# Patient Record
Sex: Male | Born: 1980 | Race: White | Hispanic: No | Marital: Single | Smoking: Current every day smoker
Health system: Southern US, Community
[De-identification: ages and names within clinical notes are randomized; demographics above are authoritative.]

## PROBLEM LIST (undated history)

## (undated) DIAGNOSIS — T07XXXA Unspecified multiple injuries, initial encounter: Secondary | ICD-10-CM

## (undated) DIAGNOSIS — G43909 Migraine, unspecified, not intractable, without status migrainosus: Secondary | ICD-10-CM

## (undated) DIAGNOSIS — K219 Gastro-esophageal reflux disease without esophagitis: Secondary | ICD-10-CM

## (undated) DIAGNOSIS — I872 Venous insufficiency (chronic) (peripheral): Secondary | ICD-10-CM

## (undated) DIAGNOSIS — S62009A Unspecified fracture of navicular [scaphoid] bone of unspecified wrist, initial encounter for closed fracture: Secondary | ICD-10-CM

## (undated) HISTORY — PX: TONSILLECTOMY: SUR1361

---

## 2002-06-30 ENCOUNTER — Emergency Department (HOSPITAL_COMMUNITY): Admission: EM | Admit: 2002-06-30 | Discharge: 2002-07-01 | Payer: Self-pay | Admitting: Plastic Surgery

## 2002-07-01 ENCOUNTER — Encounter: Payer: Self-pay | Admitting: Emergency Medicine

## 2003-02-16 ENCOUNTER — Encounter: Payer: Self-pay | Admitting: Emergency Medicine

## 2003-02-16 ENCOUNTER — Emergency Department (HOSPITAL_COMMUNITY): Admission: EM | Admit: 2003-02-16 | Discharge: 2003-02-16 | Payer: Self-pay | Admitting: Emergency Medicine

## 2004-04-01 ENCOUNTER — Emergency Department (HOSPITAL_COMMUNITY): Admission: EM | Admit: 2004-04-01 | Discharge: 2004-04-01 | Payer: Self-pay | Admitting: Emergency Medicine

## 2005-01-19 ENCOUNTER — Ambulatory Visit (HOSPITAL_COMMUNITY): Admission: RE | Admit: 2005-01-19 | Discharge: 2005-01-19 | Payer: Self-pay | Admitting: Gastroenterology

## 2005-11-05 ENCOUNTER — Emergency Department (HOSPITAL_COMMUNITY): Admission: EM | Admit: 2005-11-05 | Discharge: 2005-11-05 | Payer: Self-pay | Admitting: Emergency Medicine

## 2011-01-04 ENCOUNTER — Ambulatory Visit (INDEPENDENT_AMBULATORY_CARE_PROVIDER_SITE_OTHER): Payer: Managed Care, Other (non HMO)

## 2011-01-04 ENCOUNTER — Inpatient Hospital Stay (INDEPENDENT_AMBULATORY_CARE_PROVIDER_SITE_OTHER)
Admission: RE | Admit: 2011-01-04 | Discharge: 2011-01-04 | Disposition: A | Discharge status: Home / Self Care | Payer: Managed Care, Other (non HMO) | Source: Ambulatory Visit | Attending: Emergency Medicine | Admitting: Emergency Medicine

## 2011-01-04 DIAGNOSIS — S058X9A Other injuries of unspecified eye and orbit, initial encounter: Secondary | ICD-10-CM

## 2011-01-04 DIAGNOSIS — H18009 Unspecified corneal deposit, unspecified eye: Secondary | ICD-10-CM

## 2011-09-14 ENCOUNTER — Ambulatory Visit (HOSPITAL_BASED_OUTPATIENT_CLINIC_OR_DEPARTMENT_OTHER): Admission: RE | Admit: 2011-09-14 | Payer: BC Managed Care – PPO | Source: Ambulatory Visit | Admitting: Otolaryngology

## 2011-09-14 ENCOUNTER — Encounter (HOSPITAL_BASED_OUTPATIENT_CLINIC_OR_DEPARTMENT_OTHER): Admission: RE | Payer: Self-pay | Source: Ambulatory Visit

## 2011-09-14 SURGERY — MINOR EXCISION OF MASS
Anesthesia: LOCAL | Laterality: Right

## 2014-03-08 ENCOUNTER — Emergency Department (HOSPITAL_COMMUNITY): Payer: BC Managed Care – PPO

## 2014-03-08 ENCOUNTER — Emergency Department (HOSPITAL_COMMUNITY)
Admission: EM | Admit: 2014-03-08 | Discharge: 2014-03-09 | Disposition: A | Discharge status: Home / Self Care | Payer: BC Managed Care – PPO | Attending: Emergency Medicine | Admitting: Emergency Medicine

## 2014-03-08 ENCOUNTER — Encounter (HOSPITAL_COMMUNITY): Payer: Self-pay | Admitting: Emergency Medicine

## 2014-03-08 DIAGNOSIS — F17200 Nicotine dependence, unspecified, uncomplicated: Secondary | ICD-10-CM | POA: Insufficient documentation

## 2014-03-08 DIAGNOSIS — T07XXXA Unspecified multiple injuries, initial encounter: Secondary | ICD-10-CM

## 2014-03-08 DIAGNOSIS — Y92410 Unspecified street and highway as the place of occurrence of the external cause: Secondary | ICD-10-CM | POA: Insufficient documentation

## 2014-03-08 DIAGNOSIS — S62009A Unspecified fracture of navicular [scaphoid] bone of unspecified wrist, initial encounter for closed fracture: Secondary | ICD-10-CM

## 2014-03-08 DIAGNOSIS — Z23 Encounter for immunization: Secondary | ICD-10-CM | POA: Insufficient documentation

## 2014-03-08 DIAGNOSIS — Y9389 Activity, other specified: Secondary | ICD-10-CM | POA: Insufficient documentation

## 2014-03-08 DIAGNOSIS — IMO0002 Reserved for concepts with insufficient information to code with codable children: Secondary | ICD-10-CM | POA: Insufficient documentation

## 2014-03-08 HISTORY — DX: Unspecified multiple injuries, initial encounter: T07.XXXA

## 2014-03-08 HISTORY — DX: Unspecified fracture of navicular (scaphoid) bone of unspecified wrist, initial encounter for closed fracture: S62.009A

## 2014-03-08 LAB — CBC WITH DIFFERENTIAL/PLATELET
BASOS PCT: 0 % (ref 0–1)
Basophils Absolute: 0 10*3/uL (ref 0.0–0.1)
Eosinophils Absolute: 0.2 10*3/uL (ref 0.0–0.7)
Eosinophils Relative: 2 % (ref 0–5)
HCT: 43 % (ref 39.0–52.0)
HEMOGLOBIN: 15.5 g/dL (ref 13.0–17.0)
LYMPHS PCT: 50 % — AB (ref 12–46)
Lymphs Abs: 5.3 10*3/uL — ABNORMAL HIGH (ref 0.7–4.0)
MCH: 32.3 pg (ref 26.0–34.0)
MCHC: 36 g/dL (ref 30.0–36.0)
MCV: 89.6 fL (ref 78.0–100.0)
Monocytes Absolute: 0.6 10*3/uL (ref 0.1–1.0)
Monocytes Relative: 6 % (ref 3–12)
Neutro Abs: 4.5 10*3/uL (ref 1.7–7.7)
Neutrophils Relative %: 42 % — ABNORMAL LOW (ref 43–77)
RBC: 4.8 MIL/uL (ref 4.22–5.81)
RDW: 12.7 % (ref 11.5–15.5)
WBC: 10.6 10*3/uL — ABNORMAL HIGH (ref 4.0–10.5)

## 2014-03-08 LAB — I-STAT CHEM 8, ED
BUN: 24 mg/dL — ABNORMAL HIGH (ref 6–23)
CREATININE: 1 mg/dL (ref 0.50–1.35)
Calcium, Ion: 1.04 mmol/L — ABNORMAL LOW (ref 1.12–1.23)
Chloride: 104 mEq/L (ref 96–112)
Glucose, Bld: 119 mg/dL — ABNORMAL HIGH (ref 70–99)
HEMATOCRIT: 45 % (ref 39.0–52.0)
Hemoglobin: 15.3 g/dL (ref 13.0–17.0)
POTASSIUM: 5.7 meq/L — AB (ref 3.7–5.3)
SODIUM: 134 meq/L — AB (ref 137–147)
TCO2: 23 mmol/L (ref 0–100)

## 2014-03-08 LAB — URINALYSIS, ROUTINE W REFLEX MICROSCOPIC
Bilirubin Urine: NEGATIVE
Glucose, UA: NEGATIVE mg/dL
Hgb urine dipstick: NEGATIVE
KETONES UR: NEGATIVE mg/dL
LEUKOCYTES UA: NEGATIVE
NITRITE: NEGATIVE
PH: 6 (ref 5.0–8.0)
Protein, ur: NEGATIVE mg/dL
SPECIFIC GRAVITY, URINE: 1.008 (ref 1.005–1.030)
UROBILINOGEN UA: 0.2 mg/dL (ref 0.0–1.0)

## 2014-03-08 MED ORDER — MORPHINE SULFATE 4 MG/ML IJ SOLN
6.0000 mg | Freq: Once | INTRAMUSCULAR | Status: AC
  Administered 2014-03-08: 6 mg via INTRAVENOUS
  Filled 2014-03-08: qty 2

## 2014-03-08 MED ORDER — MORPHINE SULFATE 4 MG/ML IJ SOLN
8.0000 mg | Freq: Once | INTRAMUSCULAR | Status: AC
  Administered 2014-03-08: 4 mg via INTRAVENOUS
  Filled 2014-03-08: qty 2

## 2014-03-08 MED ORDER — TETANUS-DIPHTH-ACELL PERTUSSIS 5-2.5-18.5 LF-MCG/0.5 IM SUSP
0.5000 mL | Freq: Once | INTRAMUSCULAR | Status: AC
  Administered 2014-03-08: 0.5 mL via INTRAMUSCULAR
  Filled 2014-03-08: qty 0.5

## 2014-03-08 MED ORDER — IOHEXOL 300 MG/ML  SOLN
80.0000 mL | Freq: Once | INTRAMUSCULAR | Status: AC | PRN
  Administered 2014-03-08: 80 mL via INTRAVENOUS

## 2014-03-08 NOTE — ED Notes (Signed)
PT RETURNED FROM XRAY.
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
## 2014-03-08 NOTE — ED Notes (Signed)
Cervical collar placed on pt on arrival to room 5 with cervical spine stabilization maintained

## 2014-03-08 NOTE — ED Notes (Signed)
Pt placed into gown and on monitor upon arrival to room. Pt monitored by 5 lead, blood pressure, and pulse ox.  
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
## 2014-03-08 NOTE — ED Notes (Signed)
Cervical collar removed by dr. Ethelda Chickjacubowitz

## 2014-03-08 NOTE — ED Notes (Signed)
Pt to x ray/ct
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
## 2014-03-09 MED ORDER — OXYCODONE-ACETAMINOPHEN 5-325 MG PO TABS: 1.0000 | ORAL_TABLET | Freq: Four times a day (QID) | ORAL | Status: DC | PRN

## 2014-03-09 MED ORDER — MORPHINE SULFATE 4 MG/ML IJ SOLN
4.0000 mg | Freq: Once | INTRAMUSCULAR | Status: AC
  Administered 2014-03-09: 4 mg via INTRAVENOUS
  Filled 2014-03-09: qty 1

## 2014-03-09 MED ORDER — LIDOCAINE HCL 2 % EX GEL
1.0000 "application " | Freq: Once | CUTANEOUS | Status: AC
  Administered 2014-03-09: 1
  Filled 2014-03-09: qty 20

## 2014-03-09 MED ORDER — BACITRACIN ZINC 500 UNIT/GM EX OINT
TOPICAL_OINTMENT | Freq: Two times a day (BID) | CUTANEOUS | Status: DC
  Administered 2014-03-09: 1 via TOPICAL

## 2014-03-09 NOTE — ED Notes (Signed)
Ortho paged to apply splint. 

## 2014-03-09 NOTE — ED Notes (Signed)
Tech attempting to clean and dress wounds at this time. Will call ortho to apply splint once wounds on affected hand are dressed.
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
## 2014-03-13 ENCOUNTER — Other Ambulatory Visit (HOSPITAL_COMMUNITY): Payer: Self-pay | Admitting: Orthopedic Surgery

## 2014-03-13 DIAGNOSIS — T148XXA Other injury of unspecified body region, initial encounter: Secondary | ICD-10-CM

## 2014-03-15 ENCOUNTER — Ambulatory Visit (HOSPITAL_COMMUNITY): Payer: BC Managed Care – PPO

## 2014-03-15 ENCOUNTER — Encounter (HOSPITAL_COMMUNITY): Payer: Self-pay | Admitting: Emergency Medicine

## 2014-03-15 ENCOUNTER — Emergency Department (INDEPENDENT_AMBULATORY_CARE_PROVIDER_SITE_OTHER): Payer: Self-pay

## 2014-03-15 ENCOUNTER — Emergency Department (INDEPENDENT_AMBULATORY_CARE_PROVIDER_SITE_OTHER)
Admission: EM | Admit: 2014-03-15 | Discharge: 2014-03-15 | Disposition: A | Discharge status: Home / Self Care | Payer: Self-pay | Source: Home / Self Care | Attending: Emergency Medicine | Admitting: Emergency Medicine

## 2014-03-15 DIAGNOSIS — IMO0002 Reserved for concepts with insufficient information to code with codable children: Secondary | ICD-10-CM

## 2014-03-15 DIAGNOSIS — T148XXA Other injury of unspecified body region, initial encounter: Secondary | ICD-10-CM

## 2014-03-15 DIAGNOSIS — S62001A Unspecified fracture of navicular [scaphoid] bone of right wrist, initial encounter for closed fracture: Secondary | ICD-10-CM

## 2014-03-15 DIAGNOSIS — S62009A Unspecified fracture of navicular [scaphoid] bone of unspecified wrist, initial encounter for closed fracture: Secondary | ICD-10-CM

## 2014-03-15 MED ORDER — OXYCODONE-ACETAMINOPHEN 5-325 MG PO TABS: 1.0000 | ORAL_TABLET | Freq: Four times a day (QID) | ORAL | Status: DC | PRN

## 2014-03-15 MED ORDER — ONDANSETRON HCL 4 MG PO TABS: 4.0000 mg | ORAL_TABLET | Freq: Three times a day (TID) | ORAL | Status: DC | PRN

## 2014-03-15 NOTE — ED Provider Notes (Signed)
Patient was placed into a well formed right thumb spica splint. Brendan [name], EVAN, S   [name], MD 03/15/14 [phone]

## 2014-03-15 NOTE — Discharge Instructions (Signed)
Make sure you see Dr. Eulah PontMurphy on Tuesday for the wrist fracture.  The abrasion look good.  Nothing looks infected. Wash daily with soap and warm water. Apply neosporin and cover with bandage daily.  The ribs and the right leg are not broken but may have bruising of the bone. Apply ice as tolerated. Take tylenol and ibuprofen regularly. Use the Percocet as needed for pain. Use zofran with the percocet to help with nausea.
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
## 2014-03-19 ENCOUNTER — Encounter (HOSPITAL_BASED_OUTPATIENT_CLINIC_OR_DEPARTMENT_OTHER): Payer: Self-pay | Admitting: *Deleted

## 2014-03-20 ENCOUNTER — Other Ambulatory Visit: Payer: Self-pay | Admitting: Physician Assistant

## 2014-03-21 ENCOUNTER — Encounter (HOSPITAL_BASED_OUTPATIENT_CLINIC_OR_DEPARTMENT_OTHER)
Admission: RE | Disposition: A | Discharge status: Home / Self Care | Payer: Self-pay | Source: Ambulatory Visit | Attending: Orthopedic Surgery

## 2014-03-21 ENCOUNTER — Encounter (HOSPITAL_BASED_OUTPATIENT_CLINIC_OR_DEPARTMENT_OTHER): Payer: Self-pay | Admitting: Anesthesiology

## 2014-03-21 ENCOUNTER — Ambulatory Visit (HOSPITAL_BASED_OUTPATIENT_CLINIC_OR_DEPARTMENT_OTHER)
Admission: RE | Admit: 2014-03-21 | Discharge: 2014-03-21 | Disposition: A | Discharge status: Home / Self Care | Payer: Self-pay | Source: Ambulatory Visit | Attending: Orthopedic Surgery | Admitting: Orthopedic Surgery

## 2014-03-21 ENCOUNTER — Ambulatory Visit (HOSPITAL_BASED_OUTPATIENT_CLINIC_OR_DEPARTMENT_OTHER): Payer: Self-pay | Admitting: Anesthesiology

## 2014-03-21 DIAGNOSIS — F17200 Nicotine dependence, unspecified, uncomplicated: Secondary | ICD-10-CM | POA: Insufficient documentation

## 2014-03-21 DIAGNOSIS — K219 Gastro-esophageal reflux disease without esophagitis: Secondary | ICD-10-CM | POA: Insufficient documentation

## 2014-03-21 DIAGNOSIS — Y929 Unspecified place or not applicable: Secondary | ICD-10-CM | POA: Insufficient documentation

## 2014-03-21 DIAGNOSIS — S62009A Unspecified fracture of navicular [scaphoid] bone of unspecified wrist, initial encounter for closed fracture: Secondary | ICD-10-CM | POA: Insufficient documentation

## 2014-03-21 HISTORY — DX: Unspecified multiple injuries, initial encounter: T07.XXXA

## 2014-03-21 HISTORY — DX: Unspecified fracture of navicular (scaphoid) bone of unspecified wrist, initial encounter for closed fracture: S62.009A

## 2014-03-21 HISTORY — PX: ORIF SCAPHOID FRACTURE: SHX2130

## 2014-03-21 HISTORY — DX: Gastro-esophageal reflux disease without esophagitis: K21.9

## 2014-03-21 HISTORY — DX: Migraine, unspecified, not intractable, without status migrainosus: G43.909

## 2014-03-21 SURGERY — OPEN REDUCTION INTERNAL FIXATION (ORIF) SCAPHOID FRACTURE
Anesthesia: General | Site: Hand | Laterality: Right

## 2014-03-21 MED ORDER — MIDAZOLAM HCL 2 MG/2ML IJ SOLN
INTRAMUSCULAR | Status: AC
  Filled 2014-03-21: qty 2

## 2014-03-21 MED ORDER — ONDANSETRON HCL 4 MG/2ML IJ SOLN
4.0000 mg | Freq: Four times a day (QID) | INTRAMUSCULAR | Status: DC | PRN
Start: 2014-03-21 — End: 2014-03-21

## 2014-03-21 MED ORDER — LACTATED RINGERS IV SOLN
INTRAVENOUS | Status: DC
  Administered 2014-03-21 (×2): via INTRAVENOUS

## 2014-03-21 MED ORDER — HYDROMORPHONE HCL PF 1 MG/ML IJ SOLN
0.2500 mg | INTRAMUSCULAR | Status: DC | PRN
  Administered 2014-03-21 (×4): 0.5 mg via INTRAVENOUS

## 2014-03-21 MED ORDER — MIDAZOLAM HCL 2 MG/ML PO SYRP
12.0000 mg | ORAL_SOLUTION | Freq: Once | ORAL | Status: DC | PRN
Start: 2014-03-21 — End: 2014-03-21

## 2014-03-21 MED ORDER — METOCLOPRAMIDE HCL 5 MG/ML IJ SOLN: 5.0000 mg | Freq: Three times a day (TID) | INTRAMUSCULAR | Status: DC | PRN

## 2014-03-21 MED ORDER — LIDOCAINE HCL (CARDIAC) 20 MG/ML IV SOLN
INTRAVENOUS | Status: DC | PRN
  Administered 2014-03-21: 50 mg via INTRAVENOUS

## 2014-03-21 MED ORDER — BUPIVACAINE HCL (PF) 0.25 % IJ SOLN
INTRAMUSCULAR | Status: AC
  Filled 2014-03-21: qty 30

## 2014-03-21 MED ORDER — BUPIVACAINE-EPINEPHRINE (PF) 0.5% -1:200000 IJ SOLN
INTRAMUSCULAR | Status: DC | PRN
  Administered 2014-03-21: 25 mL via PERINEURAL

## 2014-03-21 MED ORDER — BUPIVACAINE HCL (PF) 0.25 % IJ SOLN
INTRAMUSCULAR | Status: DC | PRN
  Administered 2014-03-21: 4 mL

## 2014-03-21 MED ORDER — FENTANYL CITRATE 0.05 MG/ML IJ SOLN
INTRAMUSCULAR | Status: AC
  Filled 2014-03-21: qty 4

## 2014-03-21 MED ORDER — OXYCODONE HCL 5 MG PO TABS: 5.0000 mg | ORAL_TABLET | Freq: Once | ORAL | Status: DC | PRN

## 2014-03-21 MED ORDER — CEFAZOLIN SODIUM-DEXTROSE 2-3 GM-% IV SOLR
INTRAVENOUS | Status: AC
  Filled 2014-03-21: qty 50

## 2014-03-21 MED ORDER — OXYCODONE HCL 5 MG/5ML PO SOLN: 5.0000 mg | Freq: Once | ORAL | Status: DC | PRN

## 2014-03-21 MED ORDER — CHLORHEXIDINE GLUCONATE 4 % EX LIQD
60.0000 mL | Freq: Once | CUTANEOUS | Status: DC
Start: 2014-03-21 — End: 2014-03-21

## 2014-03-21 MED ORDER — DIPHENHYDRAMINE HCL 50 MG/ML IJ SOLN
INTRAMUSCULAR | Status: AC
  Filled 2014-03-21: qty 1

## 2014-03-21 MED ORDER — DIPHENHYDRAMINE HCL 50 MG/ML IJ SOLN
25.0000 mg | Freq: Once | INTRAMUSCULAR | Status: AC
  Administered 2014-03-21: 25 mg via INTRAVENOUS

## 2014-03-21 MED ORDER — DEXAMETHASONE SODIUM PHOSPHATE 10 MG/ML IJ SOLN
INTRAMUSCULAR | Status: DC | PRN
  Administered 2014-03-21: 10 mg via INTRAVENOUS

## 2014-03-21 MED ORDER — FENTANYL CITRATE 0.05 MG/ML IJ SOLN
50.0000 ug | INTRAMUSCULAR | Status: DC | PRN
  Administered 2014-03-21 (×2): 100 ug via INTRAVENOUS

## 2014-03-21 MED ORDER — FENTANYL CITRATE 0.05 MG/ML IJ SOLN
INTRAMUSCULAR | Status: AC
  Filled 2014-03-21: qty 2

## 2014-03-21 MED ORDER — PROPOFOL 10 MG/ML IV BOLUS
INTRAVENOUS | Status: DC | PRN
  Administered 2014-03-21: 200 mg via INTRAVENOUS

## 2014-03-21 MED ORDER — HYDROMORPHONE HCL PF 1 MG/ML IJ SOLN: 0.5000 mg | INTRAMUSCULAR | Status: DC | PRN

## 2014-03-21 MED ORDER — MIDAZOLAM HCL 2 MG/2ML IJ SOLN
1.0000 mg | INTRAMUSCULAR | Status: DC | PRN
  Administered 2014-03-21 (×2): 2 mg via INTRAVENOUS

## 2014-03-21 MED ORDER — LACTATED RINGERS IV SOLN
INTRAVENOUS | Status: DC
  Administered 2014-03-21: 10 mL/h via INTRAVENOUS

## 2014-03-21 MED ORDER — OXYCODONE-ACETAMINOPHEN 5-325 MG PO TABS: 1.0000 | ORAL_TABLET | ORAL | Status: DC | PRN

## 2014-03-21 MED ORDER — ONDANSETRON HCL 4 MG PO TABS: 4.0000 mg | ORAL_TABLET | Freq: Four times a day (QID) | ORAL | Status: DC | PRN

## 2014-03-21 MED ORDER — HYDROMORPHONE HCL PF 1 MG/ML IJ SOLN
INTRAMUSCULAR | Status: AC
Start: 2014-03-21 — End: 2014-03-21
  Filled 2014-03-21: qty 1

## 2014-03-21 MED ORDER — CEFAZOLIN SODIUM-DEXTROSE 2-3 GM-% IV SOLR
2.0000 g | INTRAVENOUS | Status: AC
  Administered 2014-03-21: 2 g via INTRAVENOUS

## 2014-03-21 MED ORDER — ONDANSETRON HCL 4 MG/2ML IJ SOLN
4.0000 mg | Freq: Once | INTRAMUSCULAR | Status: DC | PRN
Start: 2014-03-21 — End: 2014-03-21

## 2014-03-21 MED ORDER — HYDROMORPHONE HCL PF 1 MG/ML IJ SOLN
INTRAMUSCULAR | Status: AC
  Filled 2014-03-21: qty 1

## 2014-03-21 MED ORDER — METOCLOPRAMIDE HCL 5 MG PO TABS: 5.0000 mg | ORAL_TABLET | Freq: Three times a day (TID) | ORAL | Status: DC | PRN

## 2014-03-21 SURGICAL SUPPLY — 60 items
BANDAGE ELASTIC 3 VELCRO ST LF (GAUZE/BANDAGES/DRESSINGS) ×3 IMPLANT
BANDAGE ELASTIC 4 VELCRO ST LF (GAUZE/BANDAGES/DRESSINGS) IMPLANT
BIT DRILL MINI LNG ACUTRAK 2 (BIT) ×1 IMPLANT
BLADE SURG 15 STRL LF DISP TIS (BLADE) ×1 IMPLANT
BLADE SURG 15 STRL SS (BLADE) ×2
BNDG COHESIVE 3X5 TAN STRL LF (GAUZE/BANDAGES/DRESSINGS) ×3 IMPLANT
BNDG ESMARK 4X9 LF (GAUZE/BANDAGES/DRESSINGS) IMPLANT
CANISTER SUCT 1200ML W/VALVE (MISCELLANEOUS) ×3 IMPLANT
COVER TABLE BACK 60X90 (DRAPES) ×3 IMPLANT
CUFF TOURNIQUET SINGLE 18IN (TOURNIQUET CUFF) IMPLANT
DECANTER SPIKE VIAL GLASS SM (MISCELLANEOUS) IMPLANT
DRAPE EXTREMITY T 121X128X90 (DRAPE) ×3 IMPLANT
DRAPE OEC MINIVIEW 54X84 (DRAPES) IMPLANT
DRAPE U-SHAPE 47X51 STRL (DRAPES) IMPLANT
DRILL MINI LNG ACUTRAK 2 (BIT) ×3
DURAPREP 26ML APPLICATOR (WOUND CARE) ×3 IMPLANT
ELECT NEEDLE TIP 2.8 STRL (NEEDLE) IMPLANT
ELECT REM PT RETURN 9FT ADLT (ELECTROSURGICAL) ×3
ELECTRODE REM PT RTRN 9FT ADLT (ELECTROSURGICAL) ×1 IMPLANT
GAUZE SPONGE 4X4 12PLY STRL (GAUZE/BANDAGES/DRESSINGS) ×3 IMPLANT
GAUZE XEROFORM 1X8 LF (GAUZE/BANDAGES/DRESSINGS) IMPLANT
GLOVE BIOGEL PI IND STRL 7.0 (GLOVE) ×1 IMPLANT
GLOVE BIOGEL PI INDICATOR 7.0 (GLOVE) ×2
GLOVE ECLIPSE 6.5 STRL STRAW (GLOVE) ×3 IMPLANT
GLOVE ORTHO TXT STRL SZ7.5 (GLOVE) ×6 IMPLANT
GOWN STRL REUS W/ TWL LRG LVL3 (GOWN DISPOSABLE) ×2 IMPLANT
GOWN STRL REUS W/ TWL XL LVL3 (GOWN DISPOSABLE) IMPLANT
GOWN STRL REUS W/TWL LRG LVL3 (GOWN DISPOSABLE) ×4
GOWN STRL REUS W/TWL XL LVL3 (GOWN DISPOSABLE) ×3 IMPLANT
GUIDEWIRE ORTHO MINI ACTK .045 (WIRE) ×6 IMPLANT
NEEDLE HYPO 25X1 1.5 SAFETY (NEEDLE) IMPLANT
NS IRRIG 1000ML POUR BTL (IV SOLUTION) ×3 IMPLANT
PACK BASIN DAY SURGERY FS (CUSTOM PROCEDURE TRAY) ×3 IMPLANT
PAD CAST 3X4 CTTN HI CHSV (CAST SUPPLIES) ×1 IMPLANT
PAD CAST 4YDX4 CTTN HI CHSV (CAST SUPPLIES) ×1 IMPLANT
PADDING CAST ABS 4INX4YD NS (CAST SUPPLIES) ×2
PADDING CAST ABS COTTON 4X4 ST (CAST SUPPLIES) ×1 IMPLANT
PADDING CAST COTTON 3X4 STRL (CAST SUPPLIES) ×2
PADDING CAST COTTON 4X4 STRL (CAST SUPPLIES) ×2
PENCIL BUTTON HOLSTER BLD 10FT (ELECTRODE) ×3 IMPLANT
SCREW ACUTRAK 2 MINI 26MM (Screw) ×3 IMPLANT
SHEET MEDIUM DRAPE 40X70 STRL (DRAPES) ×3 IMPLANT
SLEEVE SCD COMPRESS KNEE MED (MISCELLANEOUS) IMPLANT
SPLINT PLASTER CAST XFAST 3X15 (CAST SUPPLIES) ×15 IMPLANT
SPLINT PLASTER XTRA FASTSET 3X (CAST SUPPLIES) ×30
SPONGE LAP 4X18 X RAY DECT (DISPOSABLE) IMPLANT
STAPLER VISISTAT 35W (STAPLE) IMPLANT
STOCKINETTE 4X48 STRL (DRAPES) ×3 IMPLANT
SUCTION FRAZIER TIP 10 FR DISP (SUCTIONS) ×3 IMPLANT
SUT ETHILON 3 0 PS 1 (SUTURE) IMPLANT
SUT VIC AB 2-0 SH 27 (SUTURE) ×2
SUT VIC AB 2-0 SH 27XBRD (SUTURE) ×1 IMPLANT
SUT VIC AB 3-0 SH 27 (SUTURE)
SUT VIC AB 3-0 SH 27X BRD (SUTURE) IMPLANT
SYR BULB 3OZ (MISCELLANEOUS) ×3 IMPLANT
SYR CONTROL 10ML LL (SYRINGE) IMPLANT
TOWEL OR 17X24 6PK STRL BLUE (TOWEL DISPOSABLE) ×3 IMPLANT
TUBE CONNECTING 20'X1/4 (TUBING) ×1
TUBE CONNECTING 20X1/4 (TUBING) ×2 IMPLANT
UNDERPAD 30X30 INCONTINENT (UNDERPADS AND DIAPERS) ×3 IMPLANT

## 2014-03-21 NOTE — Transfer of Care (Signed)
Immediate Anesthesia Transfer of Care Note  Patient: Brendan [name]  Procedure(s) Performed: Procedure(s): OPEN REDUCTION INTERNAL FIXATION (ORIF) RIGHT  SCAPHOID FRACTURE (Right)  Patient Location: PACU  Anesthesia Type:General  Level of Consciousness: awake and alert   Airway & Oxygen Therapy: Patient Spontanous Breathing and Patient connected to face mask oxygen  Post-op Assessment: Report given to PACU RN and Post -op Vital signs reviewed and stable  Post vital signs: Reviewed and stable  Complications: No apparent anesthesia complications

## 2014-03-21 NOTE — Anesthesia Procedure Notes (Addendum)
Anesthesia Regional Block:  Supraclavicular block  Pre-Anesthetic Checklist: ,, timeout performed, Correct Patient, Correct Site, Correct Laterality, Correct Procedure, Correct Position, site marked, Risks and benefits discussed,  Surgical consent,  Pre-op evaluation,  At surgeon's request and post-op pain management  Laterality: Right and Upper  Prep: chloraprep       Needles:  Injection technique: Single-shot  Needle Type: Echogenic Stimulator Needle     Needle Length: 5cm 5 cm Needle Gauge: 21 and 21 G    Additional Needles:  Procedures: ultrasound guided (picture in chart) Supraclavicular block Narrative:  Start time: 03/21/2014 11:22 AM End time: 03/21/2014 11:28 AM Injection made incrementally with aspirations every 5 mL.  Performed by: Personally  Anesthesiologist: Sheldon Silvan   Procedure Name: LMA Insertion Date/Time: 03/21/2014 12:14 PM Performed by: Caren Macadam Pre-anesthesia Checklist: Patient identified, Emergency Drugs available, Suction available and Patient being monitored Patient Re-evaluated:Patient Re-evaluated prior to inductionOxygen Delivery Method: Circle System Utilized Preoxygenation: Pre-oxygenation with 100% oxygen Intubation Type: IV induction Ventilation: Mask ventilation without difficulty LMA: LMA inserted LMA Size: 5.0 Number of attempts: 1 Airway Equipment and Method: bite block Placement Confirmation: positive ETCO2 and breath sounds checked- equal and bilateral Tube secured with: Tape Dental Injury: Teeth and Oropharynx as per pre-operative assessment     Procedure Name: LMA Insertion Date/Time: 03/21/2014 12:14 PM Performed by: Caren Macadam Pre-anesthesia Checklist: Patient identified, Emergency Drugs available, Suction available and Patient being monitored Patient Re-evaluated:Patient Re-evaluated prior to inductionOxygen Delivery Method: Circle System Utilized Preoxygenation: Pre-oxygenation with 100% oxygen Intubation  Type: IV induction Ventilation: Mask ventilation without difficulty LMA: LMA inserted LMA Size: 5.0 Number of attempts: 1 Airway Equipment and Method: bite block Placement Confirmation: positive ETCO2 and breath sounds checked- equal and bilateral Tube secured with: Tape Dental Injury: Teeth and Oropharynx as per pre-operative assessment

## 2014-03-21 NOTE — Interval H&P Note (Signed)
History and Physical Interval Note:  03/21/2014 7:28 AM  Brendan [name]  has presented today for surgery, with the diagnosis of RIGHT SCAPHOID FRACTURE  The various methods of treatment have been discussed with the patient and family. After consideration of risks, benefits and other options for treatment, the patient has consented to  Procedure(s): OPEN REDUCTION INTERNAL FIXATION (ORIF) RIGHT  SCAPHOID FRACTURE (Right) as a surgical intervention .  The patient's history has been reviewed, patient examined, no change in status, stable for surgery.  I have reviewed the patient's chart and labs.  Questions were answered to the patient's satisfaction.     [name]

## 2014-03-21 NOTE — Progress Notes (Signed)
Assisted Dr. Crews with right, ultrasound guided, supraclavicular block. Side rails up, monitors on throughout procedure. See vital signs in flow sheet. Tolerated Procedure well. 

## 2014-03-21 NOTE — Discharge Instructions (Signed)
ORIF Right Scaphoid Fracture  Wear sling until arm is no longer numb.  Do not remove splint.  May shower but do not soak splint.  Follow up appointment in one week.  SEEK IMMEDIATE MEDICAL CARE IF:   You develop increased redness, swelling, or pain around your incision sites.  There is pus or any unusual drainage coming from your incision sites.  You develop a fever.  You notice a bad smell coming from your incision sites.  Any of your incisions break open (edges do not stay together) after sutures or staples have been removed.    Post Anesthesia Home Care Instructions  Activity: Get plenty of rest for the remainder of the day. A responsible adult should stay with you for 24 hours following the procedure.  For the next 24 hours, DO NOT: -Drive a car -Advertising copywriter -Drink alcoholic beverages -Take any medication unless instructed by your physician -Make any legal decisions or sign important papers.  Meals: Start with liquid foods such as gelatin or soup. Progress to regular foods as tolerated. Avoid greasy, spicy, heavy foods. If nausea and/or vomiting occur, drink only clear liquids until the nausea and/or vomiting subsides. Call your physician if vomiting continues.  Special Instructions/Symptoms: Your throat may feel dry or sore from the anesthesia or the breathing tube placed in your throat during surgery. If this causes discomfort, gargle with warm salt water. The discomfort should disappear within 24 hours.    Regional Anesthesia Blocks  1. Numbness or the inability to move the "blocked" extremity may last from 3-48 hours after placement. The length of time depends on the medication injected and your individual response to the medication. If the numbness is not going away after 48 hours, call your surgeon.  2. The extremity that is blocked will need to be protected until the numbness is gone and the  Strength has returned. Because you cannot feel it, you will need to  take extra care to avoid injury. Because it may be weak, you may have difficulty moving it or using it. You may not know what position it is in without looking at it while the block is in effect.  3. For blocks in the legs and feet, returning to weight bearing and walking needs to be done carefully. You will need to wait until the numbness is entirely gone and the strength has returned. You should be able to move your leg and foot normally before you try and bear weight or walk. You will need someone to be with you when you first try to ensure you do not fall and possibly risk injury.  4. Bruising and tenderness at the needle site are common side effects and will resolve in a few days.  5. Persistent numbness or new problems with movement should be communicated to the surgeon or the [facility] [phone] [facility] [phone]).   Call your surgeon if you experience:   1.  Fever over 101.0. 2.  Inability to urinate. 3.  Nausea and/or vomiting. 4.  Extreme swelling or bruising at the surgical site. 5.  Continued bleeding from the incision. 6.  Increased pain, redness or drainage from the incision. 7.  Problems related to your pain medication. 8. Any change in color, movement and/or sensation 9. Any problems and/or concerns

## 2014-03-21 NOTE — Anesthesia Preprocedure Evaluation (Signed)
Anesthesia Evaluation  Patient identified by MRN, date of birth, ID band Patient awake    Reviewed: Allergy & Precautions, H&P , NPO status , Patient's Chart, lab work & pertinent test results  Airway Mallampati: I TM Distance: >3 FB Neck ROM: Full    Dental  (+) Teeth Intact, Dental Advisory Given   Pulmonary Current Smoker,  breath sounds clear to auscultation        Cardiovascular Rhythm:Regular Rate:Normal     Neuro/Psych    GI/Hepatic GERD-  Medicated and Controlled,  Endo/Other    Renal/GU      Musculoskeletal   Abdominal   Peds  Hematology   Anesthesia Other Findings   Reproductive/Obstetrics                           Anesthesia Physical Anesthesia Plan  ASA: I  Anesthesia Plan: General   Post-op Pain Management: MAC Combined w/ Regional for Post-op pain   Induction: Intravenous  Airway Management Planned: LMA  Additional Equipment:   Intra-op Plan:   Post-operative Plan: Extubation in OR  Informed Consent: I have reviewed the patients History and Physical, chart, labs and discussed the procedure including the risks, benefits and alternatives for the proposed anesthesia with the patient or authorized representative who has indicated his/her understanding and acceptance.   Dental advisory given  Plan Discussed with: CRNA, Anesthesiologist and Surgeon  Anesthesia Plan Comments:         Anesthesia Quick Evaluation

## 2014-03-21 NOTE — Anesthesia Postprocedure Evaluation (Signed)
  Anesthesia Post-op Note  Patient: Brendan [name]  Procedure(s) Performed: Procedure(s): OPEN REDUCTION INTERNAL FIXATION (ORIF) RIGHT  SCAPHOID FRACTURE (Right)  Patient Location: PACU  Anesthesia Type: General   Level of Consciousness: awake, alert  and oriented  Airway and Oxygen Therapy: Patient Spontanous Breathing  Post-op Pain: mild  Post-op Assessment: Post-op Vital signs reviewed  Post-op Vital Signs: Reviewed  Last Vitals:  Filed Vitals:   03/21/14 1345  BP: 141/89  Pulse: 81  Temp:   Resp: 14    Complications: No apparent anesthesia complications
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
## 2014-03-22 ENCOUNTER — Encounter (HOSPITAL_BASED_OUTPATIENT_CLINIC_OR_DEPARTMENT_OTHER): Payer: Self-pay | Admitting: Orthopedic Surgery
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
## 2015-08-23 ENCOUNTER — Emergency Department (INDEPENDENT_AMBULATORY_CARE_PROVIDER_SITE_OTHER)
Admission: EM | Admit: 2015-08-23 | Discharge: 2015-08-23 | Disposition: A | Discharge status: Home / Self Care | Payer: Self-pay | Source: Home / Self Care | Attending: Family Medicine | Admitting: Family Medicine

## 2015-08-23 ENCOUNTER — Emergency Department (INDEPENDENT_AMBULATORY_CARE_PROVIDER_SITE_OTHER): Payer: Self-pay

## 2015-08-23 ENCOUNTER — Encounter (HOSPITAL_COMMUNITY): Payer: Self-pay | Admitting: Emergency Medicine

## 2015-08-23 DIAGNOSIS — S93401A Sprain of unspecified ligament of right ankle, initial encounter: Secondary | ICD-10-CM

## 2015-08-23 MED ORDER — IBUPROFEN 800 MG PO TABS
ORAL_TABLET | ORAL | Status: AC
  Filled 2015-08-23: qty 1

## 2015-08-23 MED ORDER — IBUPROFEN 800 MG PO TABS
800.0000 mg | ORAL_TABLET | Freq: Once | ORAL | Status: AC
  Administered 2015-08-23: 800 mg via ORAL

## 2015-08-23 MED ORDER — IBUPROFEN 800 MG PO TABS: 800.0000 mg | ORAL_TABLET | Freq: Three times a day (TID) | ORAL | Status: DC
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
## 2015-08-23 NOTE — Discharge Instructions (Signed)
Wear ankle support as needed for comfort, activity as tolerated. advil and ice as needed, return or see orthopedist if further problems. °
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
## 2016-10-05 ENCOUNTER — Encounter (HOSPITAL_COMMUNITY): Payer: Self-pay | Admitting: Family Medicine

## 2016-10-05 ENCOUNTER — Ambulatory Visit (HOSPITAL_COMMUNITY)
Admission: EM | Admit: 2016-10-05 | Discharge: 2016-10-05 | Disposition: A | Discharge status: Home / Self Care | Payer: 59 | Attending: Internal Medicine | Admitting: Internal Medicine

## 2016-10-05 DIAGNOSIS — L089 Local infection of the skin and subcutaneous tissue, unspecified: Secondary | ICD-10-CM

## 2016-10-05 MED ORDER — MUPIROCIN 2 % EX OINT: 1.0000 "application " | TOPICAL_OINTMENT | Freq: Two times a day (BID) | CUTANEOUS | 0 refills | Status: AC
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
## 2016-10-05 NOTE — ED Triage Notes (Signed)
Pt here for abscess to right buttocks. sts that it started with a white head. sts now a blister.

## 2016-10-05 NOTE — Discharge Instructions (Addendum)
Wash wound gently with mild soap/water 1-2 times daily and apply antibiotic ointment/bandaid.  Do not apply harsh cleaners or pick wound; avoid pressure/rubbing from clothing or belt on wound.  Anticipate gradual improvement over the next week or two in size/redness/discomfort at site.  Recheck if any increasing redness/swelling/pain/drainage or new fever >100.5.  [facility] [place].

## 2016-11-13 ENCOUNTER — Ambulatory Visit (HOSPITAL_COMMUNITY)
Admission: EM | Admit: 2016-11-13 | Discharge: 2016-11-13 | Disposition: A | Discharge status: Home / Self Care | Payer: 59 | Attending: Family Medicine | Admitting: Family Medicine

## 2016-11-13 ENCOUNTER — Emergency Department (HOSPITAL_COMMUNITY): Payer: Self-pay

## 2016-11-13 ENCOUNTER — Emergency Department (HOSPITAL_COMMUNITY)
Admission: EM | Admit: 2016-11-13 | Discharge: 2016-11-13 | Disposition: A | Discharge status: Home / Self Care | Payer: Self-pay | Attending: Emergency Medicine | Admitting: Emergency Medicine

## 2016-11-13 ENCOUNTER — Encounter (HOSPITAL_COMMUNITY): Payer: Self-pay | Admitting: Emergency Medicine

## 2016-11-13 ENCOUNTER — Encounter (HOSPITAL_COMMUNITY): Payer: Self-pay

## 2016-11-13 DIAGNOSIS — R1032 Left lower quadrant pain: Secondary | ICD-10-CM

## 2016-11-13 DIAGNOSIS — W228XXA Striking against or struck by other objects, initial encounter: Secondary | ICD-10-CM

## 2016-11-13 DIAGNOSIS — S30811A Abrasion of abdominal wall, initial encounter: Secondary | ICD-10-CM | POA: Insufficient documentation

## 2016-11-13 DIAGNOSIS — Y929 Unspecified place or not applicable: Secondary | ICD-10-CM | POA: Insufficient documentation

## 2016-11-13 DIAGNOSIS — Y999 Unspecified external cause status: Secondary | ICD-10-CM | POA: Insufficient documentation

## 2016-11-13 DIAGNOSIS — W19XXXA Unspecified fall, initial encounter: Secondary | ICD-10-CM

## 2016-11-13 DIAGNOSIS — F17210 Nicotine dependence, cigarettes, uncomplicated: Secondary | ICD-10-CM | POA: Insufficient documentation

## 2016-11-13 DIAGNOSIS — Y9389 Activity, other specified: Secondary | ICD-10-CM | POA: Insufficient documentation

## 2016-11-13 DIAGNOSIS — W1789XA Other fall from one level to another, initial encounter: Secondary | ICD-10-CM

## 2016-11-13 DIAGNOSIS — G8911 Acute pain due to trauma: Secondary | ICD-10-CM

## 2016-11-13 DIAGNOSIS — R109 Unspecified abdominal pain: Secondary | ICD-10-CM

## 2016-11-13 LAB — COMPREHENSIVE METABOLIC PANEL
ALT: 17 U/L (ref 17–63)
AST: 23 U/L (ref 15–41)
Albumin: 4.8 g/dL (ref 3.5–5.0)
Alkaline Phosphatase: 48 U/L (ref 38–126)
Anion gap: 9 (ref 5–15)
BUN: 8 mg/dL (ref 6–20)
CHLORIDE: 103 mmol/L (ref 101–111)
CO2: 26 mmol/L (ref 22–32)
CREATININE: 0.82 mg/dL (ref 0.61–1.24)
Calcium: 9.5 mg/dL (ref 8.9–10.3)
GFR calc non Af Amer: >60 mL/min (ref >60)
Glucose, Bld: 96 mg/dL (ref 65–99)
Potassium: 3.7 mmol/L (ref 3.5–5.1)
SODIUM: 138 mmol/L (ref 135–145)
Total Bilirubin: 0.9 mg/dL (ref 0.3–1.2)
Total Protein: 7.1 g/dL (ref 6.5–8.1)

## 2016-11-13 LAB — CBC
HCT: 45.7 % (ref 39.0–52.0)
Hemoglobin: 16.3 g/dL (ref 13.0–17.0)
MCH: 31.5 pg (ref 26.0–34.0)
MCHC: 35.7 g/dL (ref 30.0–36.0)
MCV: 88.2 fL (ref 78.0–100.0)
Platelets: 232 10*3/uL (ref 150–400)
RBC: 5.18 MIL/uL (ref 4.22–5.81)
RDW: 12.7 % (ref 11.5–15.5)
WBC: 9.8 10*3/uL (ref 4.0–10.5)

## 2016-11-13 LAB — URINALYSIS, ROUTINE W REFLEX MICROSCOPIC
BILIRUBIN URINE: NEGATIVE
GLUCOSE, UA: NEGATIVE mg/dL
Hgb urine dipstick: NEGATIVE
KETONES UR: NEGATIVE mg/dL
Leukocytes, UA: NEGATIVE
Nitrite: NEGATIVE
PROTEIN: NEGATIVE mg/dL
Specific Gravity, Urine: 1.012 (ref 1.005–1.030)
pH: 7 (ref 5.0–8.0)

## 2016-11-13 MED ORDER — IBUPROFEN 800 MG PO TABS: 800.0000 mg | ORAL_TABLET | Freq: Three times a day (TID) | ORAL | 0 refills | Status: DC

## 2016-11-13 MED ORDER — IOPAMIDOL (ISOVUE-300) INJECTION 61%
INTRAVENOUS | Status: AC
  Administered 2016-11-13: 100 mL
  Filled 2016-11-13: qty 100

## 2016-11-13 MED ORDER — TRAMADOL HCL 50 MG PO TABS: 50.0000 mg | ORAL_TABLET | Freq: Four times a day (QID) | ORAL | 0 refills | Status: DC | PRN

## 2016-11-13 MED ORDER — FENTANYL CITRATE (PF) 100 MCG/2ML IJ SOLN
50.0000 ug | Freq: Once | INTRAMUSCULAR | Status: AC
  Administered 2016-11-13: 50 ug via INTRAVENOUS
  Filled 2016-11-13: qty 2
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
## 2016-11-13 NOTE — Discharge Instructions (Signed)
Your CT today was normal-- no evidence of traumatic injuries. Take the prescribed medication as directed for pain. Follow-up with your primary care doctor. Return to the ED for new or worsening symptoms.

## 2016-11-13 NOTE — ED Notes (Signed)
PA at bedside.
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
## 2016-11-13 NOTE — ED Notes (Signed)
Patient transported to CT 

## 2018-04-08 ENCOUNTER — Emergency Department (HOSPITAL_COMMUNITY): Payer: No Typology Code available for payment source

## 2018-04-08 ENCOUNTER — Encounter (HOSPITAL_COMMUNITY): Payer: Self-pay | Admitting: *Deleted

## 2018-04-08 ENCOUNTER — Emergency Department (HOSPITAL_COMMUNITY)
Admission: EM | Admit: 2018-04-08 | Discharge: 2018-04-08 | Disposition: A | Discharge status: Home / Self Care | Payer: No Typology Code available for payment source | Attending: Emergency Medicine | Admitting: Emergency Medicine

## 2018-04-08 DIAGNOSIS — F17210 Nicotine dependence, cigarettes, uncomplicated: Secondary | ICD-10-CM | POA: Insufficient documentation

## 2018-04-08 DIAGNOSIS — S51012A Laceration without foreign body of left elbow, initial encounter: Secondary | ICD-10-CM | POA: Insufficient documentation

## 2018-04-08 DIAGNOSIS — S060X1A Concussion with loss of consciousness of 30 minutes or less, initial encounter: Secondary | ICD-10-CM | POA: Diagnosis not present

## 2018-04-08 DIAGNOSIS — Y999 Unspecified external cause status: Secondary | ICD-10-CM | POA: Insufficient documentation

## 2018-04-08 DIAGNOSIS — R400 Somnolence: Secondary | ICD-10-CM | POA: Insufficient documentation

## 2018-04-08 DIAGNOSIS — Y92410 Unspecified street and highway as the place of occurrence of the external cause: Secondary | ICD-10-CM | POA: Diagnosis not present

## 2018-04-08 DIAGNOSIS — Y939 Activity, unspecified: Secondary | ICD-10-CM | POA: Insufficient documentation

## 2018-04-08 DIAGNOSIS — M25562 Pain in left knee: Secondary | ICD-10-CM | POA: Diagnosis not present

## 2018-04-08 DIAGNOSIS — R0789 Other chest pain: Secondary | ICD-10-CM | POA: Insufficient documentation

## 2018-04-08 DIAGNOSIS — S0990XA Unspecified injury of head, initial encounter: Secondary | ICD-10-CM | POA: Diagnosis present

## 2018-04-08 LAB — COMPREHENSIVE METABOLIC PANEL
ALT: 20 U/L (ref 0–44)
AST: 31 U/L (ref 15–41)
Albumin: 4.3 g/dL (ref 3.5–5.0)
Alkaline Phosphatase: 57 U/L (ref 38–126)
Anion gap: 11 (ref 5–15)
BILIRUBIN TOTAL: 1.2 mg/dL (ref 0.3–1.2)
BUN: 12 mg/dL (ref 6–20)
CHLORIDE: 102 mmol/L (ref 98–111)
CO2: 26 mmol/L (ref 22–32)
CREATININE: 0.98 mg/dL (ref 0.61–1.24)
Calcium: 9.4 mg/dL (ref 8.9–10.3)
GFR calc Af Amer: >60 mL/min (ref >60)
Glucose, Bld: 77 mg/dL (ref 70–99)
Potassium: 3.6 mmol/L (ref 3.5–5.1)
Sodium: 139 mmol/L (ref 135–145)
TOTAL PROTEIN: 6.9 g/dL (ref 6.5–8.1)

## 2018-04-08 LAB — CBC
HCT: 45.1 % (ref 39.0–52.0)
Hemoglobin: 15.6 g/dL (ref 13.0–17.0)
MCH: 31.7 pg (ref 26.0–34.0)
MCHC: 34.6 g/dL (ref 30.0–36.0)
MCV: 91.7 fL (ref 78.0–100.0)
Platelets: 261 10*3/uL (ref 150–400)
RBC: 4.92 MIL/uL (ref 4.22–5.81)
RDW: 11.9 % (ref 11.5–15.5)
WBC: 15.9 10*3/uL — ABNORMAL HIGH (ref 4.0–10.5)

## 2018-04-08 LAB — I-STAT CHEM 8, ED
BUN: 17 mg/dL (ref 6–20)
CREATININE: 0.8 mg/dL (ref 0.61–1.24)
Calcium, Ion: 1.16 mmol/L (ref 1.15–1.40)
Chloride: 101 mmol/L (ref 98–111)
Glucose, Bld: 71 mg/dL (ref 70–99)
HCT: 44 % (ref 39.0–52.0)
HEMOGLOBIN: 15 g/dL (ref 13.0–17.0)
Potassium: 3.6 mmol/L (ref 3.5–5.1)
Sodium: 140 mmol/L (ref 135–145)
TCO2: 30 mmol/L (ref 22–32)

## 2018-04-08 LAB — SAMPLE TO BLOOD BANK

## 2018-04-08 LAB — I-STAT CG4 LACTIC ACID, ED: LACTIC ACID, VENOUS: 1.42 mmol/L (ref 0.5–1.9)

## 2018-04-08 LAB — ETHANOL

## 2018-04-08 LAB — PROTIME-INR
INR: 0.97
Prothrombin Time: 12.8 seconds (ref 11.4–15.2)

## 2018-04-08 MED ORDER — HYDROCODONE-ACETAMINOPHEN 5-325 MG PO TABS: 1.0000 | ORAL_TABLET | ORAL | 0 refills | Status: DC | PRN

## 2018-04-08 MED ORDER — LIDOCAINE-EPINEPHRINE (PF) 2 %-1:200000 IJ SOLN
10.0000 mL | Freq: Once | INTRAMUSCULAR | Status: AC
  Administered 2018-04-08: 15:00:00 via INTRADERMAL
  Filled 2018-04-08: qty 20

## 2018-04-08 MED ORDER — SODIUM CHLORIDE 0.9 % IV BOLUS
1000.0000 mL | Freq: Once | INTRAVENOUS | Status: AC
  Administered 2018-04-08: 1000 mL via INTRAVENOUS
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
## 2018-04-08 NOTE — ED Provider Notes (Signed)
  LACERATION REPAIR Performed by: Harvie HeckSamantha Areana Kosanke Authorized by: Harvie HeckSamantha Jennika Ringgold Consent: Verbal consent obtained from patient and mother at bedside Risks and benefits: risks, benefits and alternatives were discussed Consent given by: patient Patient identity confirmed: provided demographic data Prepped and Draped in normal sterile fashion  Laceration Location: L elbow  Laceration Length: 2.5 cm curved skin flap type laceration  Wound explored, visualized in a bloodless field without evidence of FB.   Anesthesia: local infiltration  Local anesthetic: lidocaine 2% w epinephrine  Cleaning: Betadine Irrigation method: Pressure with sterile water Amount of cleaning: standard  Skin closure: 4-0 Nylon  Number of sutures: 5  Technique: Simple interrupted   Patient tolerance: Patient tolerated the procedure well with no immediate complications.   Cherly Andersonetrucelli, Makiyla Linch R, [place] JerseyPA-C 04/08/18 1542    Tegeler, Canary Brimhristopher J, MD 04/08/18 [phone]
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
## 2018-04-08 NOTE — ED Notes (Signed)
Pt departed in NAD.  
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
## 2018-04-08 NOTE — Progress Notes (Signed)
I received a page to meet patient/family in ED POD31 to provide spiritual support. I was unable to see the patient because medical staff were attending to him. I spoke with patient's mother and sister-in-law. I led in prayer and provided comfort to the patient's family. I shared that the Chaplain is available if additional support is needed or requested.    04/08/18 1100  Clinical Encounter Type  Visited With Family  Visit Type Spiritual support  Referral From Nurse  Consult/Referral To Chaplain  Spiritual Encounters  Spiritual Needs Prayer  Stress Factors  Patient Stress Factors None identified  Family Stress Factors Exhausted    Chaplain Dr Melvyn NovasMichael Orvis Stann

## 2018-04-08 NOTE — Discharge Instructions (Signed)
Your work-up today showed no evidence of fracture dislocation or bleeding.  Your left knee likely has soft tissue or ligamentous injury given the swelling and extent of your discomfort.  Please leave the knee immobilizer in place with ambulation and use the crutches to minimize weightbearing.  Please follow-up with orthopedics for further management of this.  We suspect you have a concussion causing her sleepiness after the head CT was reassuring.  Please stay hydrated and use the pain medicine to help with the knee pain.  Please be careful not to have a fall.  Please follow-up with your primary doctor in several days for further assessment and management.  If any symptoms change or worsen, please return to the nearest emergency department.

## 2018-04-18 ENCOUNTER — Other Ambulatory Visit (HOSPITAL_COMMUNITY): Payer: Self-pay | Admitting: Orthopedic Surgery

## 2018-04-18 ENCOUNTER — Ambulatory Visit (HOSPITAL_COMMUNITY): Admission: RE | Admit: 2018-04-18 | Payer: Self-pay | Source: Ambulatory Visit

## 2018-04-18 DIAGNOSIS — M79605 Pain in left leg: Secondary | ICD-10-CM

## 2018-04-18 DIAGNOSIS — M7989 Other specified soft tissue disorders: Principal | ICD-10-CM

## 2018-04-19 ENCOUNTER — Ambulatory Visit (HOSPITAL_COMMUNITY): Admission: RE | Admit: 2018-04-19 | Payer: Self-pay | Source: Ambulatory Visit

## 2018-04-19 ENCOUNTER — Other Ambulatory Visit (HOSPITAL_COMMUNITY): Payer: Self-pay | Admitting: Cardiology

## 2018-04-19 ENCOUNTER — Emergency Department (HOSPITAL_BASED_OUTPATIENT_CLINIC_OR_DEPARTMENT_OTHER): Payer: Self-pay

## 2018-04-19 ENCOUNTER — Emergency Department (HOSPITAL_COMMUNITY)
Admission: EM | Admit: 2018-04-19 | Discharge: 2018-04-19 | Discharge status: Against Medical Advice | Payer: Self-pay | Attending: Emergency Medicine | Admitting: Emergency Medicine

## 2018-04-19 ENCOUNTER — Encounter (HOSPITAL_COMMUNITY): Payer: Self-pay | Admitting: Emergency Medicine

## 2018-04-19 DIAGNOSIS — M7989 Other specified soft tissue disorders: Secondary | ICD-10-CM

## 2018-04-19 DIAGNOSIS — M79605 Pain in left leg: Secondary | ICD-10-CM

## 2018-04-19 DIAGNOSIS — R609 Edema, unspecified: Secondary | ICD-10-CM

## 2018-04-19 DIAGNOSIS — Z5321 Procedure and treatment not carried out due to patient leaving prior to being seen by health care provider: Secondary | ICD-10-CM | POA: Insufficient documentation

## 2018-04-19 NOTE — Progress Notes (Signed)
Patient no show x 2 for LE venous from Dr. Thurston Hole.   Farrel Demark, RDMS, RVT Vascular lab

## 2018-04-19 NOTE — ED Notes (Signed)
Patient reports that he will come back some other time to be seen by dr and to get an MRI.

## 2018-04-19 NOTE — Progress Notes (Signed)
LLE venous duplex prelim: negative for DVT. Nazaiah Navarrete Eunice, RDMS, RVT  
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
## 2018-07-31 ENCOUNTER — Emergency Department (HOSPITAL_COMMUNITY): Payer: Self-pay

## 2018-07-31 ENCOUNTER — Other Ambulatory Visit: Payer: Self-pay

## 2018-07-31 ENCOUNTER — Encounter (HOSPITAL_COMMUNITY): Payer: Self-pay | Admitting: Emergency Medicine

## 2018-07-31 ENCOUNTER — Emergency Department (HOSPITAL_COMMUNITY)
Admission: EM | Admit: 2018-07-31 | Discharge: 2018-07-31 | Disposition: A | Discharge status: Home / Self Care | Payer: Self-pay | Attending: Emergency Medicine | Admitting: Emergency Medicine

## 2018-07-31 DIAGNOSIS — M79604 Pain in right leg: Secondary | ICD-10-CM | POA: Insufficient documentation

## 2018-07-31 DIAGNOSIS — F17210 Nicotine dependence, cigarettes, uncomplicated: Secondary | ICD-10-CM | POA: Insufficient documentation

## 2018-07-31 DIAGNOSIS — R600 Localized edema: Secondary | ICD-10-CM | POA: Insufficient documentation

## 2018-07-31 DIAGNOSIS — M79605 Pain in left leg: Secondary | ICD-10-CM | POA: Insufficient documentation

## 2018-07-31 DIAGNOSIS — Z79899 Other long term (current) drug therapy: Secondary | ICD-10-CM | POA: Insufficient documentation

## 2018-07-31 LAB — COMPREHENSIVE METABOLIC PANEL
ALBUMIN: 4.1 g/dL (ref 3.5–5.0)
ALT: 15 U/L (ref 0–44)
ANION GAP: 9 (ref 5–15)
AST: 16 U/L (ref 15–41)
Alkaline Phosphatase: 52 U/L (ref 38–126)
BUN: 15 mg/dL (ref 6–20)
CHLORIDE: 104 mmol/L (ref 98–111)
CO2: 29 mmol/L (ref 22–32)
Calcium: 9 mg/dL (ref 8.9–10.3)
Creatinine, Ser: 0.97 mg/dL (ref 0.61–1.24)
GFR calc Af Amer: >60 mL/min (ref >60)
GFR calc non Af Amer: >60 mL/min (ref >60)
GLUCOSE: 99 mg/dL (ref 70–99)
POTASSIUM: 4 mmol/L (ref 3.5–5.1)
SODIUM: 142 mmol/L (ref 135–145)
TOTAL PROTEIN: 6.9 g/dL (ref 6.5–8.1)
Total Bilirubin: 0.7 mg/dL (ref 0.3–1.2)

## 2018-07-31 LAB — CBC WITH DIFFERENTIAL/PLATELET
Abs Immature Granulocytes: 0.03 K/uL (ref 0.00–0.07)
Basophils Absolute: 0.1 K/uL (ref 0.0–0.1)
Basophils Relative: 1 %
Eosinophils Absolute: 0.3 K/uL (ref 0.0–0.5)
Eosinophils Relative: 4 %
HCT: 45.7 % (ref 39.0–52.0)
Hemoglobin: 15.3 g/dL (ref 13.0–17.0)
Immature Granulocytes: 0 %
Lymphocytes Relative: 24 %
Lymphs Abs: 2 K/uL (ref 0.7–4.0)
MCH: 31.2 pg (ref 26.0–34.0)
MCHC: 33.5 g/dL (ref 30.0–36.0)
MCV: 93.3 fL (ref 80.0–100.0)
Monocytes Absolute: 0.8 K/uL (ref 0.1–1.0)
Monocytes Relative: 9 %
Neutro Abs: 5.1 K/uL (ref 1.7–7.7)
Neutrophils Relative %: 62 %
Platelets: 229 K/uL (ref 150–400)
RBC: 4.9 MIL/uL (ref 4.22–5.81)
RDW: 11.9 % (ref 11.5–15.5)
WBC: 8.3 K/uL (ref 4.0–10.5)
nRBC: 0 % (ref 0.0–0.2)

## 2018-07-31 LAB — PROTIME-INR
INR: 0.86
Prothrombin Time: 11.7 s (ref 11.4–15.2)

## 2018-07-31 LAB — I-STAT CG4 LACTIC ACID, ED: LACTIC ACID, VENOUS: 0.84 mmol/L (ref 0.5–1.9)

## 2018-07-31 MED ORDER — SULFAMETHOXAZOLE-TRIMETHOPRIM 800-160 MG PO TABS
1.0000 | ORAL_TABLET | Freq: Once | ORAL | Status: AC
  Administered 2018-07-31: 1 via ORAL
  Filled 2018-07-31: qty 1

## 2018-07-31 MED ORDER — SULFAMETHOXAZOLE-TRIMETHOPRIM 800-160 MG PO TABS: 1.0000 | ORAL_TABLET | Freq: Two times a day (BID) | ORAL | 0 refills | Status: AC

## 2018-07-31 NOTE — ED Notes (Signed)
Vascular tech at bedside. °

## 2018-07-31 NOTE — ED Notes (Signed)
Blood cultures obtained from [facility] with kurin device. 2nd set obtained from [facility] via straight stick.

## 2018-07-31 NOTE — Progress Notes (Signed)
Bilateral lower extremities venous duplex exam completed. Please see preliminary notes on CV PROC under chart review. Vandana Haman H Ainara Eldridge(RDMS RVT) 07/31/18 2:14 PM

## 2018-07-31 NOTE — ED Notes (Signed)
ED Provider at bedside. 
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
## 2018-07-31 NOTE — ED Notes (Signed)
Pt given urinal and told we need a urine sample. 

## 2018-07-31 NOTE — Discharge Instructions (Signed)
Please return for any problem.  Follow-up with your regular care provider as instructed. °

## 2018-08-05 LAB — CULTURE, BLOOD (ROUTINE X 2)
CULTURE: NO GROWTH
Culture: NO GROWTH
Special Requests: ADEQUATE

## 2019-03-14 ENCOUNTER — Encounter (HOSPITAL_BASED_OUTPATIENT_CLINIC_OR_DEPARTMENT_OTHER): Payer: Self-pay

## 2019-03-14 ENCOUNTER — Other Ambulatory Visit: Payer: Self-pay

## 2019-03-14 ENCOUNTER — Emergency Department (HOSPITAL_BASED_OUTPATIENT_CLINIC_OR_DEPARTMENT_OTHER)
Admission: EM | Admit: 2019-03-14 | Discharge: 2019-03-15 | Disposition: A | Discharge status: Home / Self Care | Payer: Self-pay | Attending: Emergency Medicine | Admitting: Emergency Medicine

## 2019-03-14 DIAGNOSIS — Z79899 Other long term (current) drug therapy: Secondary | ICD-10-CM | POA: Insufficient documentation

## 2019-03-14 DIAGNOSIS — F17210 Nicotine dependence, cigarettes, uncomplicated: Secondary | ICD-10-CM | POA: Insufficient documentation

## 2019-03-14 DIAGNOSIS — R600 Localized edema: Secondary | ICD-10-CM | POA: Insufficient documentation

## 2019-03-14 DIAGNOSIS — Z189 Retained foreign body fragments, unspecified material: Secondary | ICD-10-CM

## 2019-03-14 DIAGNOSIS — M7989 Other specified soft tissue disorders: Secondary | ICD-10-CM

## 2019-03-14 DIAGNOSIS — Z1810 Retained metal fragments, unspecified: Secondary | ICD-10-CM | POA: Insufficient documentation

## 2019-03-14 HISTORY — DX: Venous insufficiency (chronic) (peripheral): I87.2

## 2019-03-14 LAB — BASIC METABOLIC PANEL
Anion gap: 9 (ref 5–15)
BUN: 10 mg/dL (ref 6–20)
CO2: 27 mmol/L (ref 22–32)
Calcium: 9.1 mg/dL (ref 8.9–10.3)
Chloride: 102 mmol/L (ref 98–111)
Creatinine, Ser: 0.83 mg/dL (ref 0.61–1.24)
GFR calc Af Amer: >60 mL/min (ref >60)
GFR calc non Af Amer: >60 mL/min (ref >60)
Glucose, Bld: 103 mg/dL — ABNORMAL HIGH (ref 70–99)
Potassium: 3.7 mmol/L (ref 3.5–5.1)
Sodium: 138 mmol/L (ref 135–145)

## 2019-03-15 ENCOUNTER — Ambulatory Visit (HOSPITAL_BASED_OUTPATIENT_CLINIC_OR_DEPARTMENT_OTHER): Payer: Self-pay

## 2019-03-15 ENCOUNTER — Encounter (HOSPITAL_BASED_OUTPATIENT_CLINIC_OR_DEPARTMENT_OTHER): Payer: Self-pay | Admitting: Emergency Medicine

## 2019-03-15 ENCOUNTER — Ambulatory Visit (HOSPITAL_BASED_OUTPATIENT_CLINIC_OR_DEPARTMENT_OTHER)
Admission: RE | Admit: 2019-03-15 | Discharge: 2019-03-15 | Disposition: A | Discharge status: Home / Self Care | Payer: Self-pay | Source: Ambulatory Visit | Attending: Emergency Medicine | Admitting: Emergency Medicine

## 2019-03-15 ENCOUNTER — Emergency Department (HOSPITAL_BASED_OUTPATIENT_CLINIC_OR_DEPARTMENT_OTHER): Payer: Self-pay

## 2019-03-15 DIAGNOSIS — M7989 Other specified soft tissue disorders: Secondary | ICD-10-CM

## 2019-03-15 MED ORDER — NAPROXEN 375 MG PO TABS: 375.0000 mg | ORAL_TABLET | Freq: Two times a day (BID) | ORAL | 0 refills | Status: DC

## 2019-03-15 MED ORDER — TETANUS-DIPHTH-ACELL PERTUSSIS 5-2.5-18.5 LF-MCG/0.5 IM SUSP
0.5000 mL | Freq: Once | INTRAMUSCULAR | Status: DC
  Filled 2019-03-15: qty 0.5

## 2019-03-15 MED ORDER — NAPROXEN 250 MG PO TABS
500.0000 mg | ORAL_TABLET | Freq: Once | ORAL | Status: AC
  Administered 2019-03-15: 500 mg via ORAL
  Filled 2019-03-15: qty 2

## 2019-03-15 MED ORDER — DOXYCYCLINE HYCLATE 100 MG PO CAPS: 100.0000 mg | ORAL_CAPSULE | Freq: Two times a day (BID) | ORAL | 0 refills | Status: DC

## 2019-03-15 MED ORDER — DOXYCYCLINE HYCLATE 100 MG PO TABS
100.0000 mg | ORAL_TABLET | Freq: Once | ORAL | Status: AC
  Administered 2019-03-15: 100 mg via ORAL
  Filled 2019-03-15: qty 1

## 2019-03-15 NOTE — ED Notes (Signed)
ED Provider at bedside. 
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
## 2019-03-15 NOTE — ED Notes (Signed)
Patient transported to X-ray 

## 2019-03-15 NOTE — Discharge Instructions (Signed)
Return for doppler at 245 pm today.  You have retained foreign bodies in the right lower leg follow up for removal

## 2019-03-25 ENCOUNTER — Emergency Department (HOSPITAL_BASED_OUTPATIENT_CLINIC_OR_DEPARTMENT_OTHER): Payer: Self-pay

## 2019-03-25 ENCOUNTER — Other Ambulatory Visit: Payer: Self-pay

## 2019-03-25 ENCOUNTER — Encounter (HOSPITAL_BASED_OUTPATIENT_CLINIC_OR_DEPARTMENT_OTHER): Payer: Self-pay | Admitting: Emergency Medicine

## 2019-03-25 ENCOUNTER — Ambulatory Visit (INDEPENDENT_AMBULATORY_CARE_PROVIDER_SITE_OTHER)
Admission: RE | Admit: 2019-03-25 | Discharge: 2019-03-25 | Disposition: A | Discharge status: Other Acute Inpatient Hospital | Payer: Self-pay | Source: Ambulatory Visit

## 2019-03-25 ENCOUNTER — Emergency Department (HOSPITAL_BASED_OUTPATIENT_CLINIC_OR_DEPARTMENT_OTHER)
Admission: EM | Admit: 2019-03-25 | Discharge: 2019-03-26 | Disposition: A | Discharge status: Home / Self Care | Payer: Self-pay | Attending: Emergency Medicine | Admitting: Emergency Medicine

## 2019-03-25 DIAGNOSIS — F17210 Nicotine dependence, cigarettes, uncomplicated: Secondary | ICD-10-CM | POA: Insufficient documentation

## 2019-03-25 DIAGNOSIS — M7989 Other specified soft tissue disorders: Secondary | ICD-10-CM

## 2019-03-25 DIAGNOSIS — L03115 Cellulitis of right lower limb: Secondary | ICD-10-CM | POA: Insufficient documentation

## 2019-03-25 DIAGNOSIS — Z79899 Other long term (current) drug therapy: Secondary | ICD-10-CM | POA: Insufficient documentation

## 2019-03-25 DIAGNOSIS — R600 Localized edema: Secondary | ICD-10-CM

## 2019-03-25 LAB — BASIC METABOLIC PANEL
Anion gap: 9 (ref 5–15)
BUN: 20 mg/dL (ref 6–20)
CO2: 28 mmol/L (ref 22–32)
Calcium: 9.4 mg/dL (ref 8.9–10.3)
Chloride: 100 mmol/L (ref 98–111)
Creatinine, Ser: 0.77 mg/dL (ref 0.61–1.24)
GFR calc Af Amer: >60 mL/min (ref >60)
GFR calc non Af Amer: >60 mL/min (ref >60)
Glucose, Bld: 90 mg/dL (ref 70–99)
Potassium: 3.7 mmol/L (ref 3.5–5.1)
Sodium: 137 mmol/L (ref 135–145)

## 2019-03-25 LAB — CBC WITH DIFFERENTIAL/PLATELET
Abs Immature Granulocytes: 0.04 10*3/uL (ref 0.00–0.07)
Basophils Absolute: 0 10*3/uL (ref 0.0–0.1)
Basophils Relative: 0 %
Eosinophils Absolute: 0.2 10*3/uL (ref 0.0–0.5)
Eosinophils Relative: 2 %
HCT: 43.3 % (ref 39.0–52.0)
Hemoglobin: 14.5 g/dL (ref 13.0–17.0)
Immature Granulocytes: 0 %
Lymphocytes Relative: 16 %
Lymphs Abs: 1.6 10*3/uL (ref 0.7–4.0)
MCH: 30.8 pg (ref 26.0–34.0)
MCHC: 33.5 g/dL (ref 30.0–36.0)
MCV: 91.9 fL (ref 80.0–100.0)
Monocytes Absolute: 1.1 10*3/uL — ABNORMAL HIGH (ref 0.1–1.0)
Monocytes Relative: 11 %
Neutro Abs: 6.9 10*3/uL (ref 1.7–7.7)
Neutrophils Relative %: 71 %
Platelets: 222 10*3/uL (ref 150–400)
RBC: 4.71 MIL/uL (ref 4.22–5.81)
RDW: 11.6 % (ref 11.5–15.5)
WBC: 9.7 10*3/uL (ref 4.0–10.5)
nRBC: 0 % (ref 0.0–0.2)

## 2019-03-25 LAB — RAPID URINE DRUG SCREEN, HOSP PERFORMED
Amphetamines: POSITIVE — AB
Barbiturates: NOT DETECTED
Benzodiazepines: POSITIVE — AB
Cocaine: NOT DETECTED
Opiates: NOT DETECTED
Tetrahydrocannabinol: POSITIVE — AB

## 2019-03-25 LAB — LACTIC ACID, PLASMA: Lactic Acid, Venous: 0.9 mmol/L (ref 0.5–1.9)

## 2019-03-25 MED ORDER — VANCOMYCIN HCL 10 G IV SOLR
1750.0000 mg | Freq: Once | INTRAVENOUS | Status: AC
  Administered 2019-03-25: 1750 mg via INTRAVENOUS
  Filled 2019-03-25: qty 1750

## 2019-03-25 MED ORDER — VANCOMYCIN HCL 500 MG IV SOLR
INTRAVENOUS | Status: AC
  Filled 2019-03-25: qty 1000

## 2019-03-25 MED ORDER — CLINDAMYCIN HCL 150 MG PO CAPS: 450.0000 mg | ORAL_CAPSULE | Freq: Three times a day (TID) | ORAL | 0 refills | Status: AC

## 2019-03-25 MED ORDER — VANCOMYCIN HCL 10 G IV SOLR
1250.0000 mg | Freq: Three times a day (TID) | INTRAVENOUS | Status: DC
  Filled 2019-03-25: qty 1250

## 2019-03-25 MED ORDER — VANCOMYCIN HCL 1000 MG IV SOLR
INTRAVENOUS | Status: AC
  Filled 2019-03-25: qty 1000

## 2019-03-25 NOTE — Discharge Instructions (Addendum)
You have been diagnosed today with cellulitis of the right lower extremity and bilateral leg swelling.  At this time there does not appear to be the presence of an emergent medical condition, however there is always the potential for conditions to change. Please read and follow the below instructions.  Please return to the Emergency Department immediately for any new or worsening symptoms or if your symptoms do not improve within 3 days. Please be sure to follow up with your Primary Care Provider within one week regarding your visit today; please call their office to schedule an appointment even if you are feeling better for a follow-up visit. Please take the new medication clindamycin as prescribed, 450 mg 3 times daily for the next 10 days for treatment of your leg infection.  Please elevate your legs to help with swelling and use compression stockings.  Please call your primary care doctor's office tomorrow morning to schedule a follow-up appointment regarding your visit today.  As we discussed you were offered admission to the hospital for further treatment which you have you refused, if you have any new or worsening symptoms you may return to the emergency department anytime for further evaluation and treatment.  Get help right away if: Your symptoms get worse. You feel very sleepy. You throw up (vomit) or have watery poop (diarrhea) for a long time. You see red streaks coming from the area. Your red area gets larger. Your red area turns dark in color. You have shortness of breath or chest pain. You cannot breathe when you lie down. You have pain, redness, or warmth in the swollen areas. You have heart, liver, or kidney disease and get edema all of a sudden. You have a fever or your symptoms get worse all of a sudden. You have any new/concerning or worsening symptoms  Please read the additional information packets attached to your discharge summary.  Do not take your medicine if  develop  an itchy rash, swelling in your mouth or lips, or difficulty breathing; call 911 and seek immediate emergency medical attention if this occurs.

## 2019-03-25 NOTE — ED Notes (Signed)
Vancomycin IVPB mixture verified with Joelene Millin [facility].

## 2019-03-25 NOTE — Progress Notes (Signed)
Pharmacy Antibiotic Note  Brendan [name] is a 38 y.o. male admitted on 03/25/2019 with cellulitis.  Pharmacy has been consulted for vancomycin dosing. Afebrile, WBC WNL. SCr pending.  Plan: Vancomycin 1750mg  IV x 1; f/u SCr to enter maintenance doses Monitor clinical progress, c/s, renal function F/u de-escalation plan/LOT, vancomycin levels as indicated   Height: 5\' 11"  (180.3 cm) Weight: 183 lb (83 kg) IBW/kg (Calculated) : 75.3  Temp (24hrs), Avg:98.5 F (36.9 C), Min:98.2 F (36.8 C), Max:98.7 F (37.1 C)  Recent Labs  Lab 03/25/19 2021  WBC 9.7    Estimated Creatinine Clearance: 127.3 mL/min (by C-G formula based on SCr of 0.83 mg/dL).    No Known Allergies  Elicia Lamp, PharmD, BCPS Please check AMION for all [place] contact numbers Clinical Pharmacist 03/25/2019 8:40 PM

## 2019-03-25 NOTE — ED Triage Notes (Signed)
Bilateral leg swelling x 3 days. Seen last week for leg swelling but states the right one is worse today. Redness and swelling noted.
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
## 2019-03-25 NOTE — Progress Notes (Signed)
Pharmacy Antibiotic Note  ALAA EYERMAN II is a 38 y.o. male admitted on 03/25/2019 with cellulitis.  Pharmacy has been consulted for Vancomycin dosing. WBC/renal function normal.  Plan: Vancomycin 1750 mg IV x 1 already given, now give 1250 mg IV q8h >>Estimated AUC: 483 Trend WBC, temp, renal function  F/U infectious work-up Drug levels as indicated   Height: 5\' 11"  (180.3 cm) Weight: 183 lb (83 kg) IBW/kg (Calculated) : 75.3  Temp (24hrs), Avg:98.5 F (36.9 C), Min:98.2 F (36.8 C), Max:98.7 F (37.1 C)  Recent Labs  Lab 03/25/19 2021  WBC 9.7  CREATININE 0.77  LATICACIDVEN 0.9    Estimated Creatinine Clearance: 132 mL/min (by C-G formula based on SCr of 0.77 mg/dL).    No Known Allergies  Narda Bonds, PharmD, BCPS Clinical Pharmacist Phone: [phone]
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
## 2019-03-25 NOTE — Discharge Instructions (Addendum)
Go to the emergency department for evaluation of your lower leg swelling and redness.

## 2019-03-26 NOTE — ED Notes (Signed)
Pt very drowsy at time of discharge. Provided paperwork and 1 RX, reviewed with him several times. Ambulated pt to check out window to arouse him.

## 2019-03-30 LAB — CULTURE, BLOOD (ROUTINE X 2)
Culture: NO GROWTH
Culture: NO GROWTH
Special Requests: ADEQUATE
Special Requests: ADEQUATE

## 2020-04-16 ENCOUNTER — Other Ambulatory Visit: Payer: Self-pay

## 2020-04-16 ENCOUNTER — Emergency Department (HOSPITAL_BASED_OUTPATIENT_CLINIC_OR_DEPARTMENT_OTHER)
Admission: EM | Admit: 2020-04-16 | Discharge: 2020-04-16 | Disposition: A | Discharge status: Home / Self Care | Payer: Self-pay | Attending: Emergency Medicine | Admitting: Emergency Medicine

## 2020-04-16 ENCOUNTER — Encounter (HOSPITAL_BASED_OUTPATIENT_CLINIC_OR_DEPARTMENT_OTHER): Payer: Self-pay | Admitting: Emergency Medicine

## 2020-04-16 DIAGNOSIS — F17210 Nicotine dependence, cigarettes, uncomplicated: Secondary | ICD-10-CM | POA: Insufficient documentation

## 2020-04-16 DIAGNOSIS — Y9289 Other specified places as the place of occurrence of the external cause: Secondary | ICD-10-CM | POA: Insufficient documentation

## 2020-04-16 DIAGNOSIS — X58XXXA Exposure to other specified factors, initial encounter: Secondary | ICD-10-CM | POA: Insufficient documentation

## 2020-04-16 DIAGNOSIS — S0501XA Injury of conjunctiva and corneal abrasion without foreign body, right eye, initial encounter: Secondary | ICD-10-CM | POA: Insufficient documentation

## 2020-04-16 MED ORDER — GENTAMICIN SULFATE 0.3 % OP SOLN: 2.0000 [drp] | OPHTHALMIC | 0 refills | Status: AC

## 2020-04-16 MED ORDER — TETRACAINE HCL 0.5 % OP SOLN
2.0000 [drp] | Freq: Once | OPHTHALMIC | Status: AC
  Administered 2020-04-16: 2 [drp] via OPHTHALMIC
  Filled 2020-04-16: qty 4

## 2020-04-16 NOTE — ED Triage Notes (Signed)
Right eye injury 2 days ago, obvious redness and swelling , unable to keep eye open

## 2020-04-16 NOTE — Discharge Instructions (Addendum)
Apply drops every 4 hours during the day as prescribed. Follow up with ophthalmology.  Return to ER for new or worsening symptoms.
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
## 2020-04-21 ENCOUNTER — Emergency Department (HOSPITAL_COMMUNITY)
Admission: EM | Admit: 2020-04-21 | Discharge: 2020-04-21 | Disposition: A | Discharge status: Home / Self Care | Payer: Medicaid Other | Attending: Emergency Medicine | Admitting: Emergency Medicine

## 2020-04-21 ENCOUNTER — Other Ambulatory Visit: Payer: Self-pay

## 2020-04-21 ENCOUNTER — Encounter (HOSPITAL_COMMUNITY): Payer: Self-pay | Admitting: Emergency Medicine

## 2020-04-21 DIAGNOSIS — M79659 Pain in unspecified thigh: Secondary | ICD-10-CM | POA: Insufficient documentation

## 2020-04-21 DIAGNOSIS — H5711 Ocular pain, right eye: Secondary | ICD-10-CM | POA: Insufficient documentation

## 2020-04-21 DIAGNOSIS — F17210 Nicotine dependence, cigarettes, uncomplicated: Secondary | ICD-10-CM | POA: Insufficient documentation

## 2020-04-21 MED ORDER — TETRACAINE HCL 0.5 % OP SOLN
2.0000 [drp] | Freq: Once | OPHTHALMIC | Status: AC
  Administered 2020-04-21: 2 [drp] via OPHTHALMIC
  Filled 2020-04-21: qty 4

## 2020-04-21 MED ORDER — FLUORESCEIN SODIUM 1 MG OP STRP
1.0000 | ORAL_STRIP | Freq: Once | OPHTHALMIC | Status: AC
  Administered 2020-04-21: 1 via OPHTHALMIC
  Filled 2020-04-21: qty 1

## 2020-04-21 NOTE — ED Triage Notes (Signed)
Pt c/o continued eye pain since 9/20. Pt given prescription eye drops but no relief. Here for re-eval.
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
## 2020-12-16 IMAGING — DX RIGHT TIBIA AND FIBULA - 2 VIEW
5 series · 5 of 5 positions shown · non-contrast
Comparison: None available.

CLINICAL DATA: Initial evaluation for new right lower leg pain and
swelling.

EXAM:
RIGHT TIBIA AND FIBULA - 2 VIEW

[tibia ap (1 of 3)]
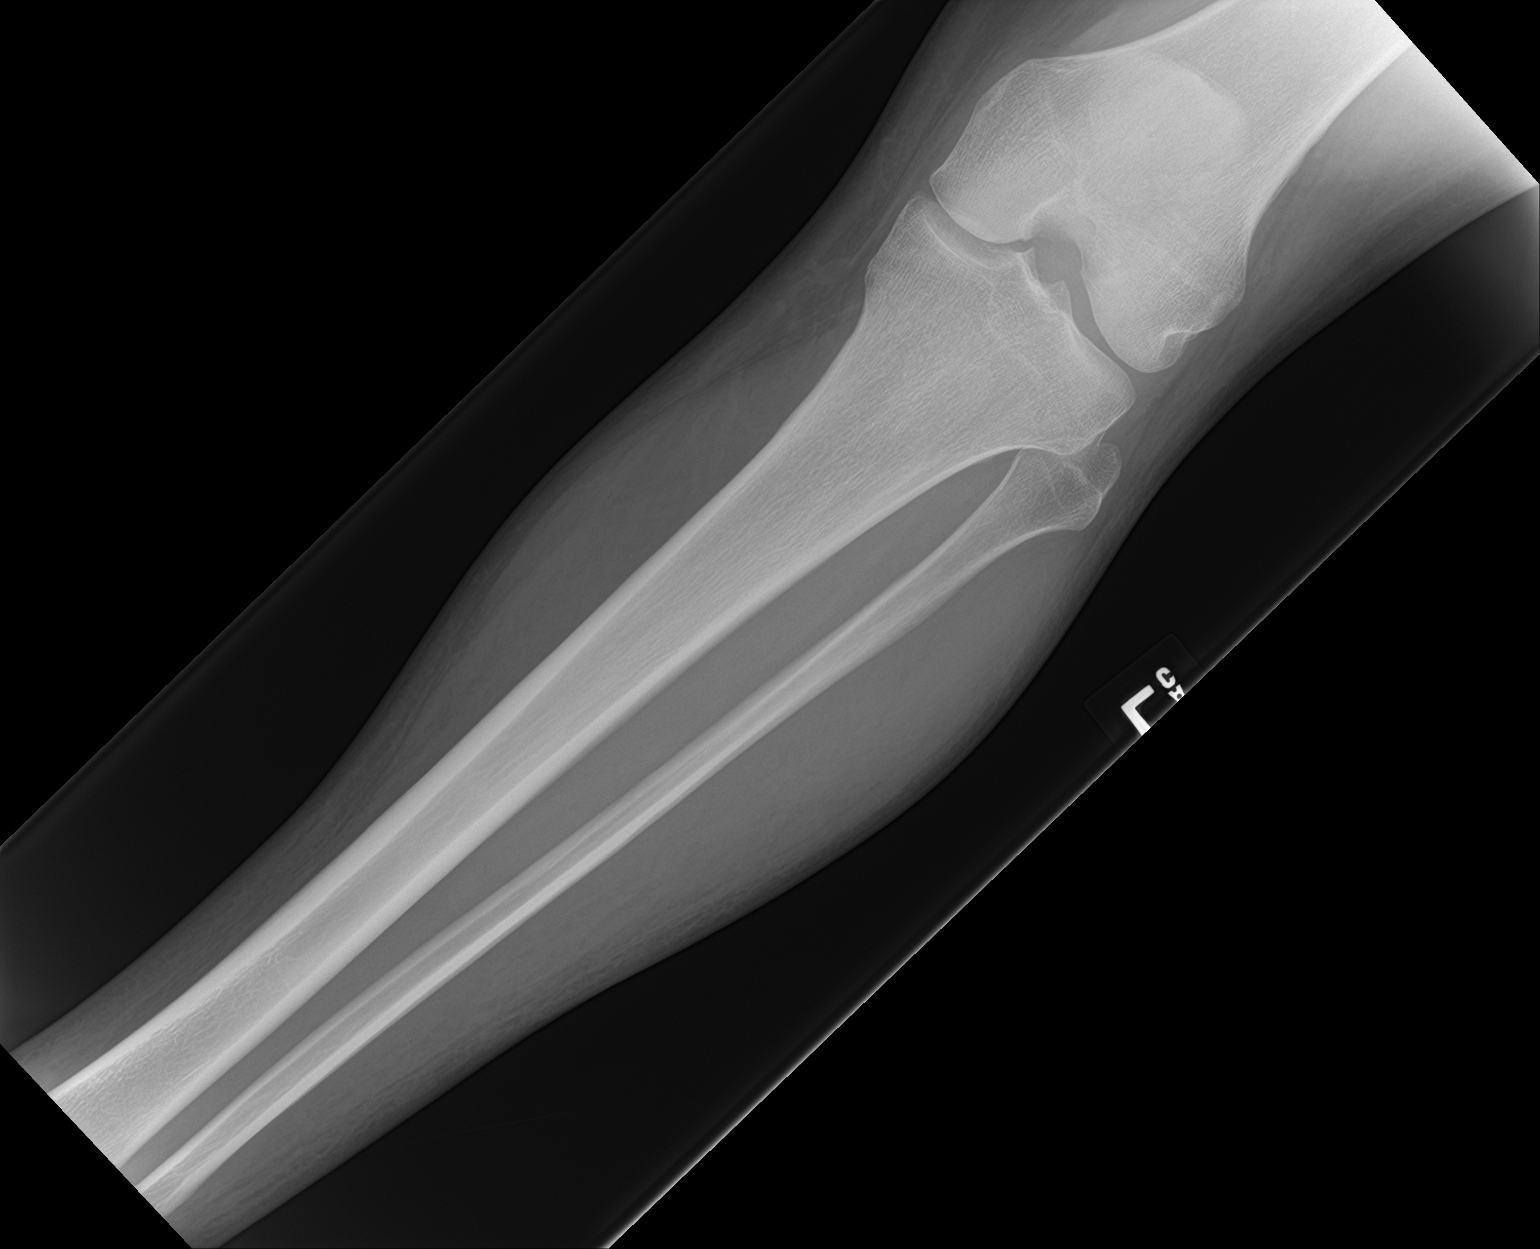

[tibia ap (2 of 3)]
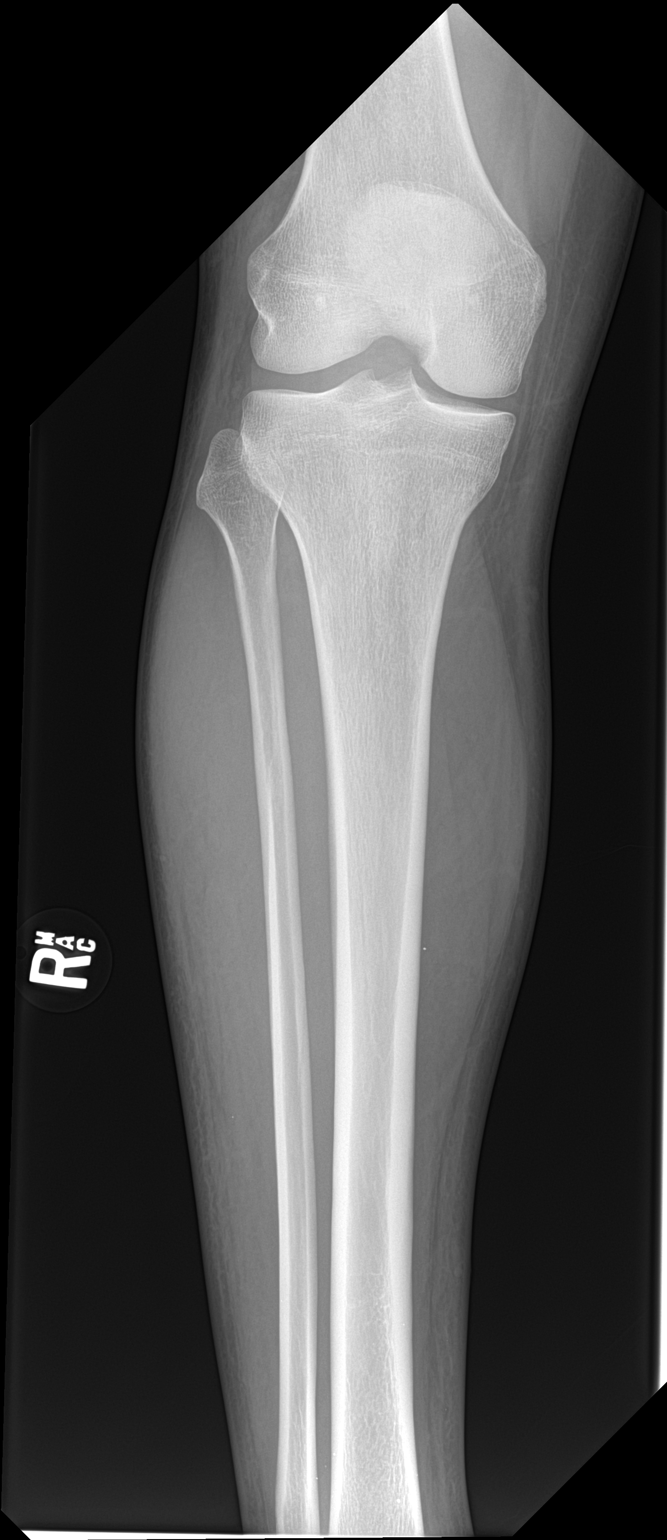

[tibia lat (1 of 2)]
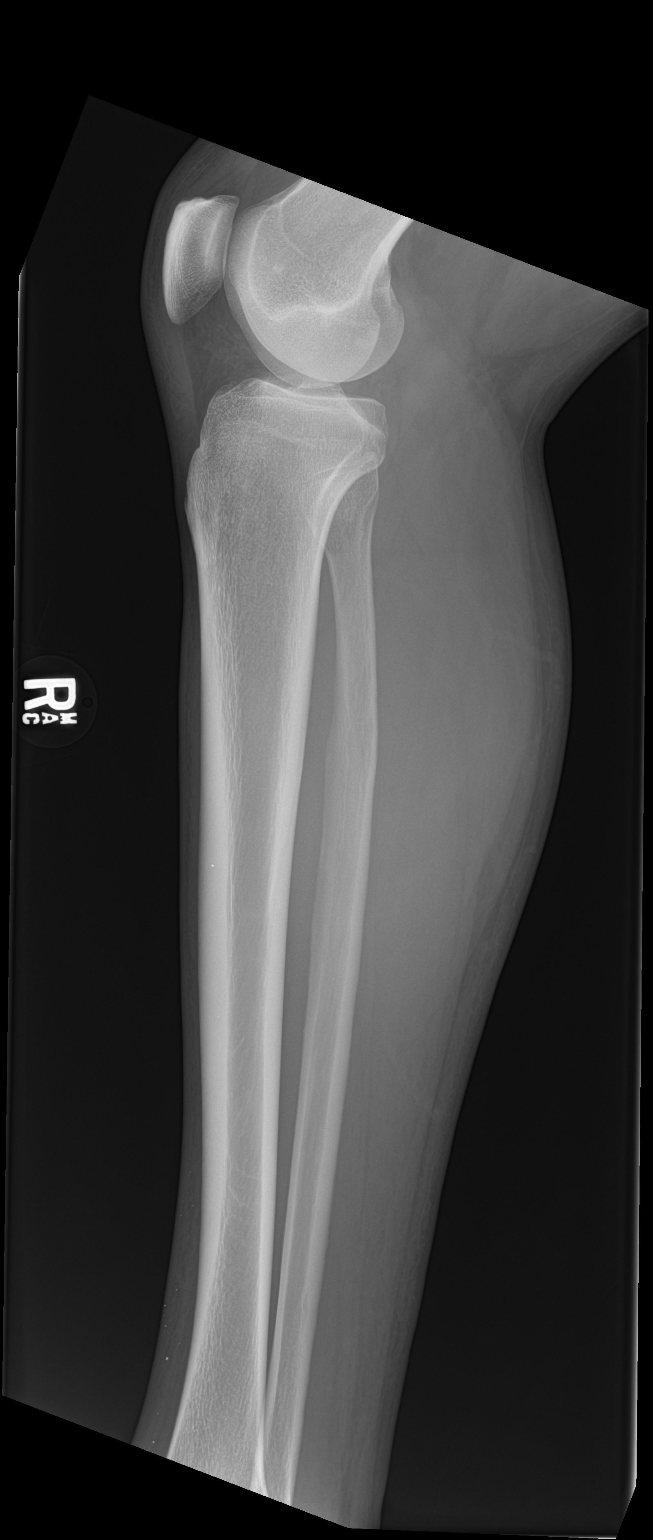

[tibia lat (2 of 2)]
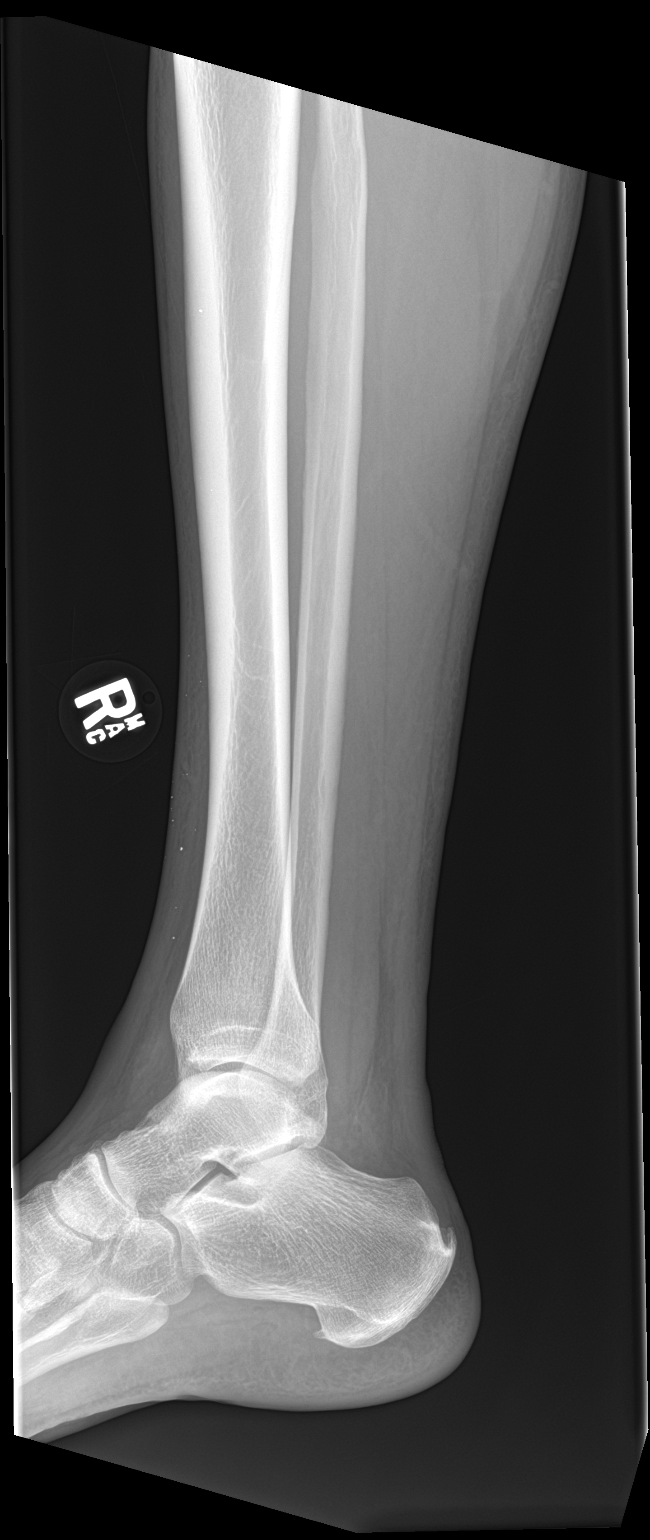

[tibia ap (3 of 3)]
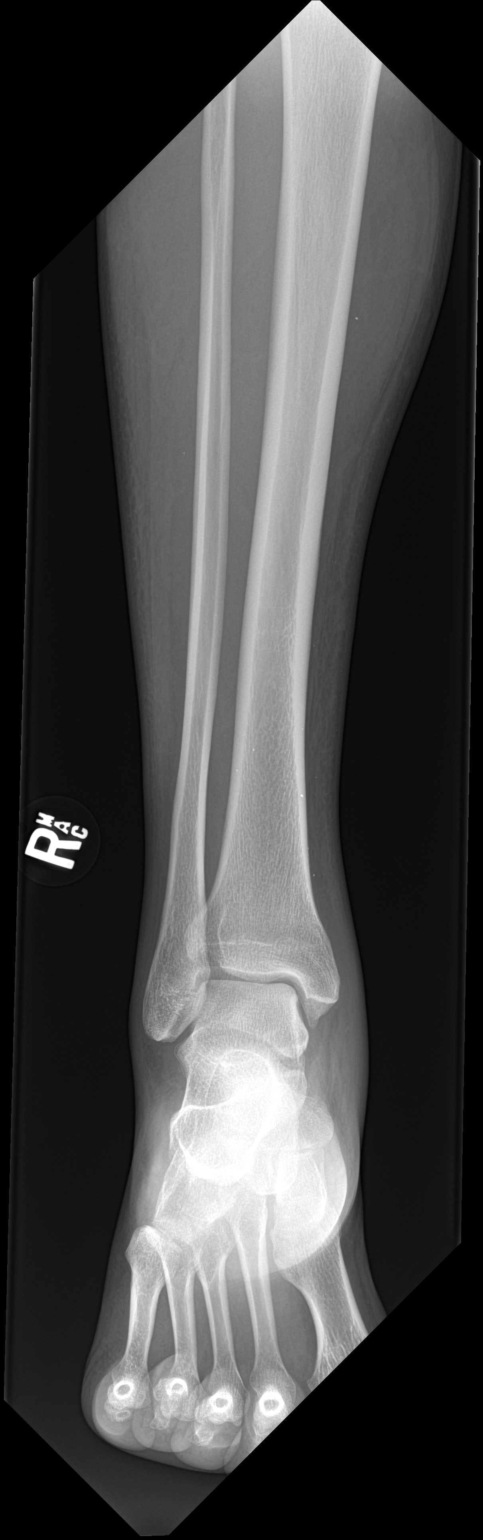

[5 of 5 positions shown; findings below may reference images not displayed]

FINDINGS: No acute fracture dislocation. Osseous mineralization normal.
Limited views of the knee and ankle unremarkable.

Few scattered punctate radiopaque/metallic densities noted within
the soft tissues of the mid and lower leg, suspicious for small
foreign bodies. Soft tissues otherwise unremarkable.
IMPRESSION: 1. Few scattered punctate radiopaque densities within the soft
tissues of the mid and lower leg, suspicious for small foreign
bodies. Soft tissues otherwise negative.
2. No acute osseous abnormality.

## 2020-12-16 IMAGING — US VENOUS DOPPLER ULTRASOUND OF  LOWER EXTREMITIES
1 series · 13 of 24 positions shown · non-contrast
Comparison: None.

CLINICAL DATA: Leg swelling



[Series 1: venous doppler ultrasound of lower extremities · 13 of 57 slices shown]
[im 1/57]
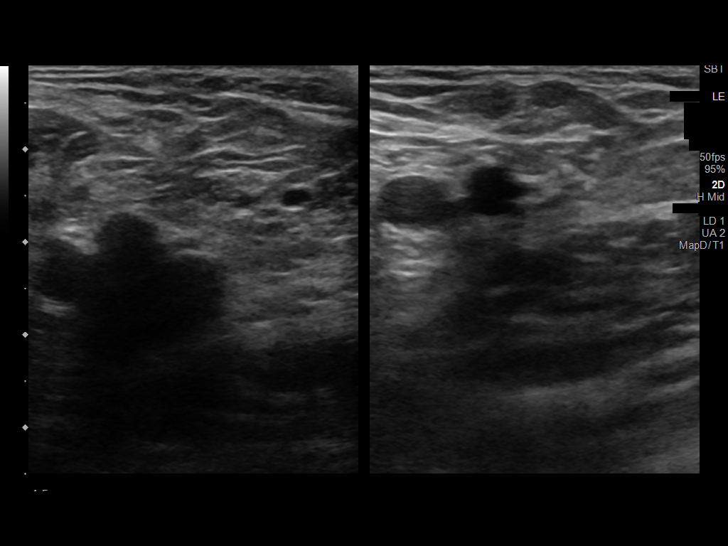
[im 5/57]
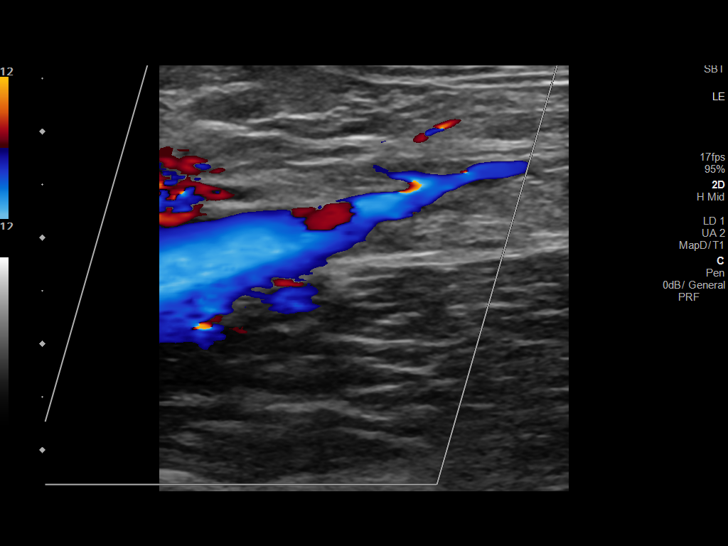
[im 10/57]
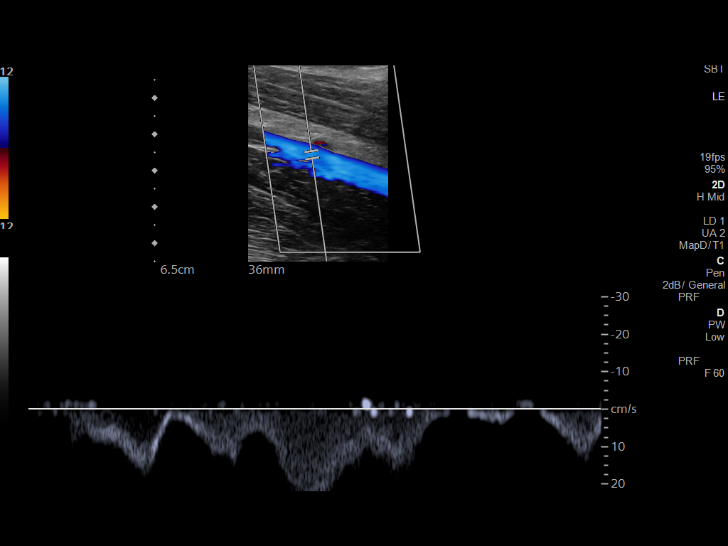
[im 15/57]
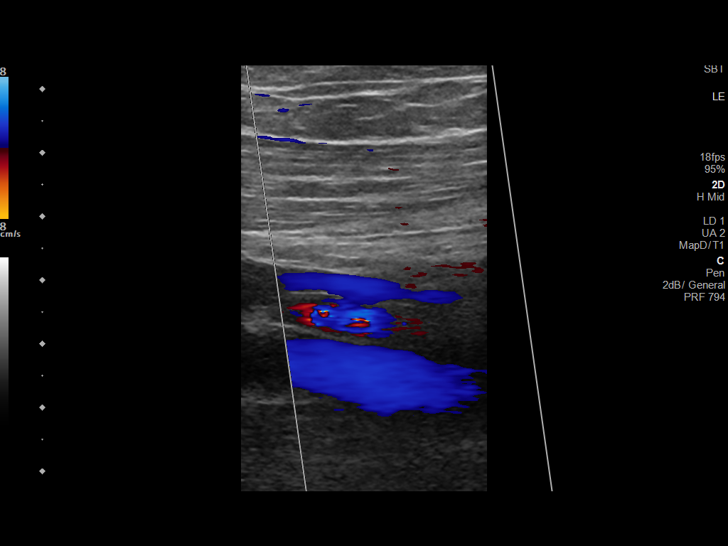
[im 20/57]
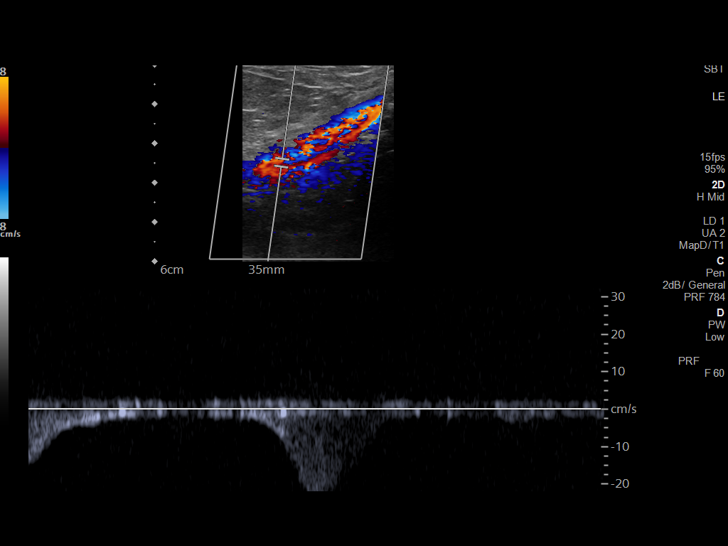
[im 25/57]
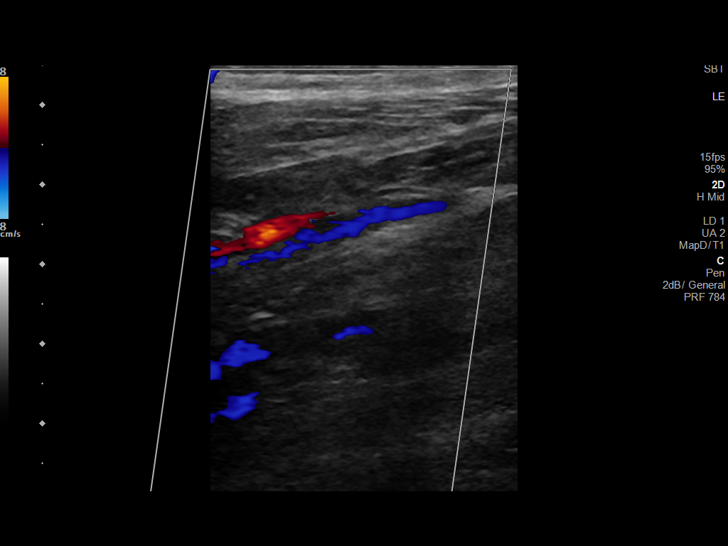
[im 30/57]
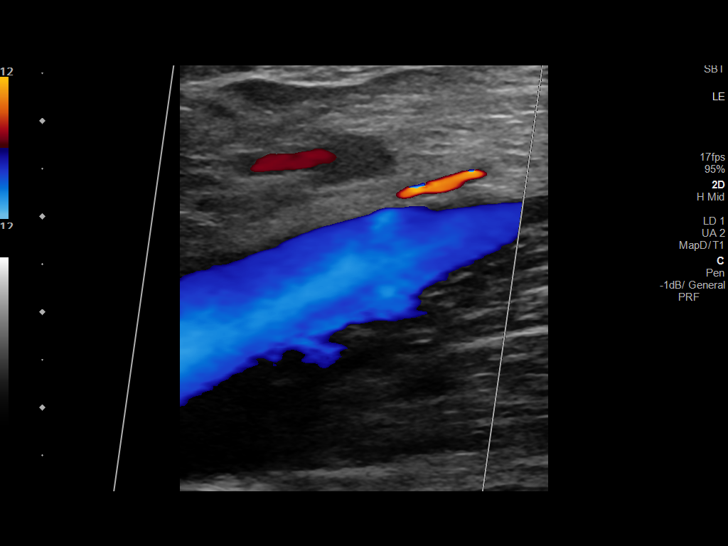
[im 32/57]
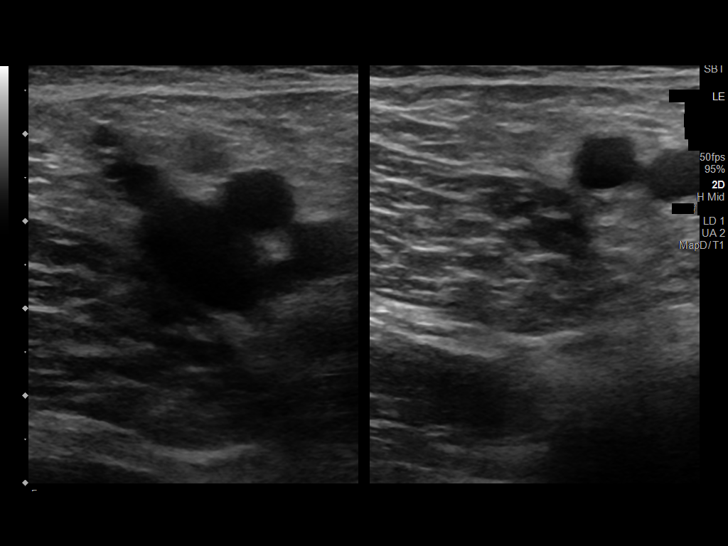
[im 37/57]
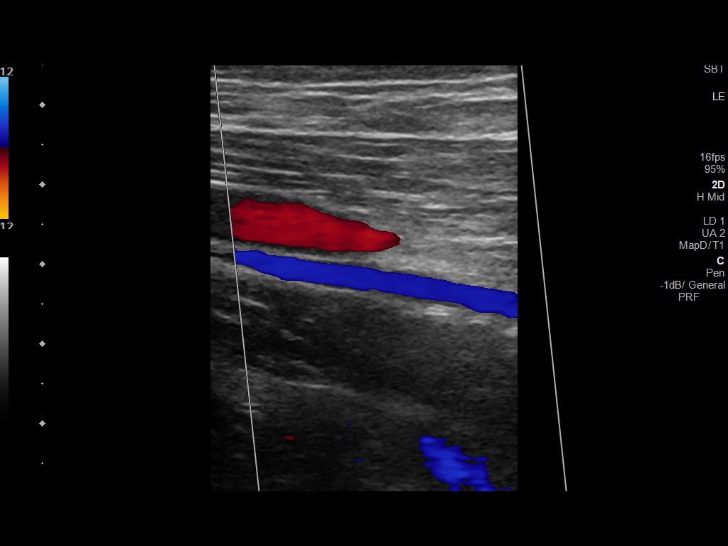
[im 42/57]
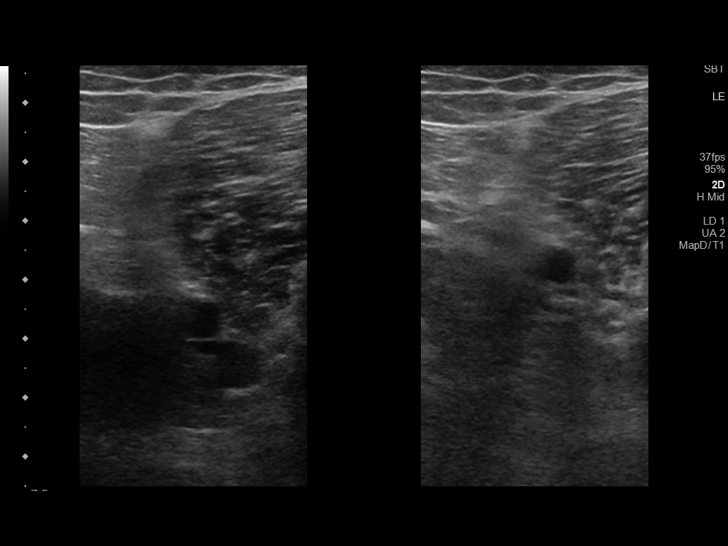
[im 47/57]
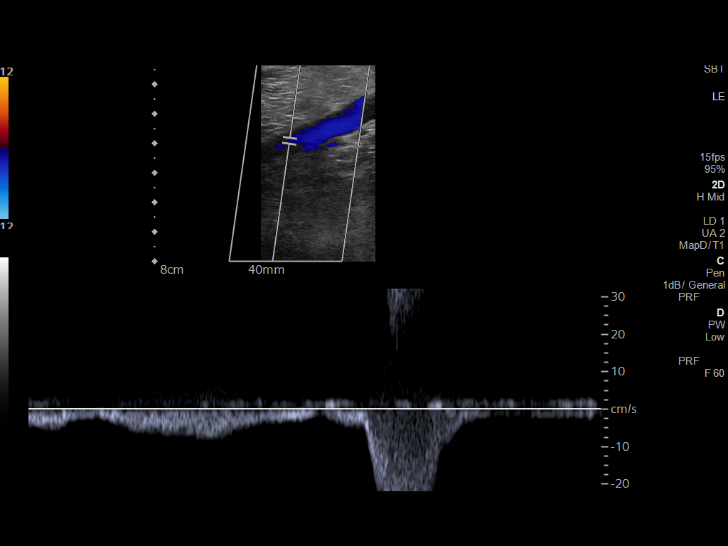
[im 52/57]
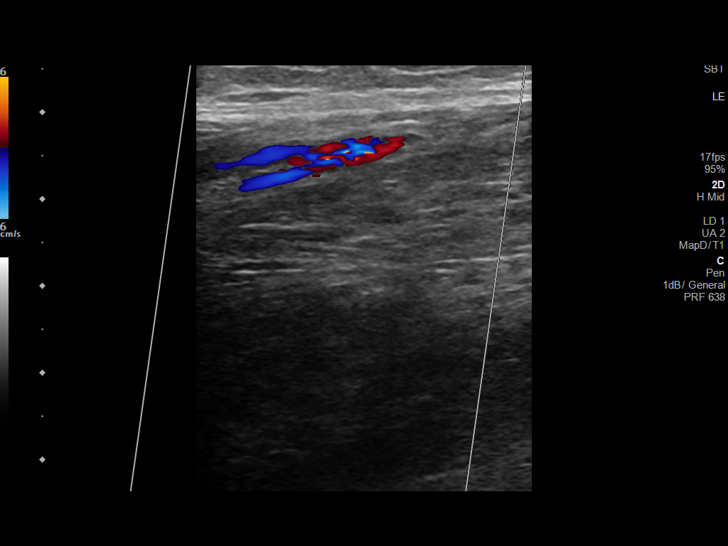
[im 57/57]
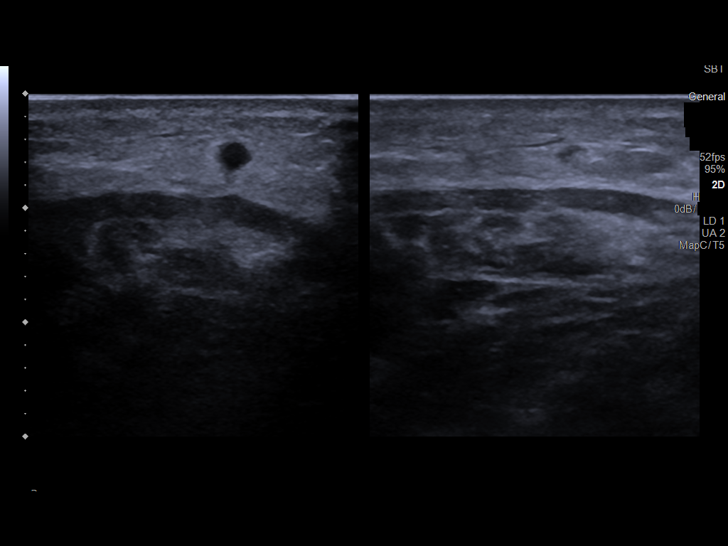

[13 of 24 positions shown; findings below may reference images not displayed]

FINDINGS: RIGHT LOWER EXTREMITY

Common Femoral Vein: No evidence of thrombus. Normal
compressibility, respiratory phasicity and response to augmentation.

Saphenofemoral Junction: No evidence of thrombus. Normal
compressibility and flow on color Doppler imaging.

Profunda Femoral Vein: No evidence of thrombus. Normal
compressibility and flow on color Doppler imaging.

Femoral Vein: No evidence of thrombus. Normal compressibility,
respiratory phasicity and response to augmentation.

Popliteal Vein: No evidence of thrombus. Normal compressibility,
respiratory phasicity and response to augmentation.

Calf Veins: No evidence of thrombus. Normal compressibility and flow
on color Doppler imaging.

Superficial Great Saphenous Vein: No evidence of thrombus. Normal
compressibility.

Venous Reflux:  None.

Other Findings:  None.

LEFT LOWER EXTREMITY

Common Femoral Vein: No evidence of thrombus. Normal
compressibility, respiratory phasicity and response to augmentation.

Saphenofemoral Junction: No evidence of thrombus. Normal
compressibility and flow on color Doppler imaging.

Profunda Femoral Vein: No evidence of thrombus. Normal
compressibility and flow on color Doppler imaging.

Femoral Vein: No evidence of thrombus. Normal compressibility,
respiratory phasicity and response to augmentation.

Popliteal Vein: No evidence of thrombus. Normal compressibility,
respiratory phasicity and response to augmentation.

Calf Veins: No evidence of thrombus. Normal compressibility and flow
on color Doppler imaging.

Superficial Great Saphenous Vein: No evidence of thrombus. Normal
compressibility.

Venous Reflux:  None.

Other Findings:  None.
IMPRESSION: No evidence of deep venous thrombosis in either lower extremity.
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
